# Patient Record
Sex: Female | Born: 1960 | Race: Black or African American | Hispanic: No | Marital: Married | Smoking: Former smoker
Health system: Southern US, Community
[De-identification: ages and names within clinical notes are randomized; demographics above are authoritative.]

## PROBLEM LIST (undated history)

## (undated) DIAGNOSIS — F329 Major depressive disorder, single episode, unspecified: Secondary | ICD-10-CM

## (undated) DIAGNOSIS — F419 Anxiety disorder, unspecified: Secondary | ICD-10-CM

## (undated) DIAGNOSIS — E119 Type 2 diabetes mellitus without complications: Secondary | ICD-10-CM

## (undated) DIAGNOSIS — F1010 Alcohol abuse, uncomplicated: Secondary | ICD-10-CM

## (undated) DIAGNOSIS — G5601 Carpal tunnel syndrome, right upper limb: Secondary | ICD-10-CM

## (undated) DIAGNOSIS — Z87898 Personal history of other specified conditions: Secondary | ICD-10-CM

## (undated) DIAGNOSIS — Z853 Personal history of malignant neoplasm of breast: Secondary | ICD-10-CM

## (undated) DIAGNOSIS — C801 Malignant (primary) neoplasm, unspecified: Secondary | ICD-10-CM

## (undated) DIAGNOSIS — E7800 Pure hypercholesterolemia, unspecified: Secondary | ICD-10-CM

## (undated) DIAGNOSIS — Q112 Microphthalmos: Secondary | ICD-10-CM

## (undated) DIAGNOSIS — G479 Sleep disorder, unspecified: Secondary | ICD-10-CM

## (undated) DIAGNOSIS — Z973 Presence of spectacles and contact lenses: Secondary | ICD-10-CM

## (undated) HISTORY — PX: GASTRIC BYPASS: SHX52

## (undated) HISTORY — PX: ABDOMINAL HYSTERECTOMY: SHX81

## (undated) HISTORY — DX: Anxiety disorder, unspecified: F41.9

## (undated) HISTORY — PX: REDUCTION MAMMAPLASTY: SUR839

## (undated) HISTORY — DX: Personal history of malignant neoplasm of breast: Z85.3

## (undated) HISTORY — DX: Pure hypercholesterolemia, unspecified: E78.00

## (undated) HISTORY — DX: Sleep disorder, unspecified: G47.9

## (undated) HISTORY — DX: Alcohol abuse, uncomplicated: F10.10

## (undated) HISTORY — DX: Microphthalmos: Q11.2

---

## 1997-01-31 DIAGNOSIS — Z853 Personal history of malignant neoplasm of breast: Secondary | ICD-10-CM

## 1997-01-31 HISTORY — PX: MASTECTOMY: SHX3

## 1997-01-31 HISTORY — DX: Personal history of malignant neoplasm of breast: Z85.3

## 1997-11-13 ENCOUNTER — Ambulatory Visit (HOSPITAL_COMMUNITY): Admission: RE | Admit: 1997-11-13 | Discharge: 1997-11-13 | Payer: Self-pay | Admitting: General Surgery

## 1997-12-11 ENCOUNTER — Encounter: Payer: Self-pay | Admitting: General Surgery

## 1997-12-12 ENCOUNTER — Inpatient Hospital Stay (HOSPITAL_COMMUNITY): Admission: RE | Admit: 1997-12-12 | Discharge: 1997-12-14 | Payer: Self-pay | Admitting: General Surgery

## 1998-08-18 ENCOUNTER — Other Ambulatory Visit: Admission: RE | Admit: 1998-08-18 | Discharge: 1998-08-18 | Payer: Self-pay | Admitting: Family Medicine

## 1998-11-09 ENCOUNTER — Other Ambulatory Visit: Admission: RE | Admit: 1998-11-09 | Discharge: 1998-11-09 | Payer: Self-pay | Admitting: Obstetrics and Gynecology

## 1998-11-09 ENCOUNTER — Encounter (INDEPENDENT_AMBULATORY_CARE_PROVIDER_SITE_OTHER): Payer: Self-pay | Admitting: *Deleted

## 1999-01-21 ENCOUNTER — Encounter (INDEPENDENT_AMBULATORY_CARE_PROVIDER_SITE_OTHER): Payer: Self-pay | Admitting: *Deleted

## 1999-01-21 ENCOUNTER — Other Ambulatory Visit: Admission: RE | Admit: 1999-01-21 | Discharge: 1999-01-21 | Payer: Self-pay | Admitting: Plastic Surgery

## 1999-03-15 ENCOUNTER — Other Ambulatory Visit: Admission: RE | Admit: 1999-03-15 | Discharge: 1999-03-15 | Payer: Self-pay | Admitting: Obstetrics and Gynecology

## 1999-05-04 ENCOUNTER — Inpatient Hospital Stay (HOSPITAL_COMMUNITY): Admission: AD | Admit: 1999-05-04 | Discharge: 1999-05-04 | Payer: Self-pay | Admitting: Obstetrics and Gynecology

## 1999-06-16 ENCOUNTER — Encounter: Payer: Self-pay | Admitting: Oncology

## 1999-06-16 ENCOUNTER — Other Ambulatory Visit: Admission: RE | Admit: 1999-06-16 | Discharge: 1999-06-16 | Payer: Self-pay | Admitting: Obstetrics and Gynecology

## 1999-06-16 ENCOUNTER — Ambulatory Visit (HOSPITAL_COMMUNITY): Admission: RE | Admit: 1999-06-16 | Discharge: 1999-06-16 | Payer: Self-pay | Admitting: Oncology

## 1999-07-26 ENCOUNTER — Inpatient Hospital Stay (HOSPITAL_COMMUNITY): Admission: RE | Admit: 1999-07-26 | Discharge: 1999-07-29 | Payer: Self-pay | Admitting: Obstetrics and Gynecology

## 1999-07-26 ENCOUNTER — Encounter (INDEPENDENT_AMBULATORY_CARE_PROVIDER_SITE_OTHER): Payer: Self-pay

## 1999-07-26 HISTORY — PX: TOTAL ABDOMINAL HYSTERECTOMY W/ BILATERAL SALPINGOOPHORECTOMY: SHX83

## 1999-10-25 ENCOUNTER — Ambulatory Visit (HOSPITAL_COMMUNITY): Admission: RE | Admit: 1999-10-25 | Discharge: 1999-10-25 | Payer: Self-pay | Admitting: Oncology

## 1999-11-02 ENCOUNTER — Encounter: Payer: Self-pay | Admitting: Orthopaedic Surgery

## 1999-11-02 ENCOUNTER — Ambulatory Visit (HOSPITAL_COMMUNITY): Admission: RE | Admit: 1999-11-02 | Discharge: 1999-11-02 | Payer: Self-pay | Admitting: Orthopaedic Surgery

## 2000-01-05 ENCOUNTER — Encounter: Payer: Self-pay | Admitting: Oncology

## 2000-01-05 ENCOUNTER — Encounter: Admission: RE | Admit: 2000-01-05 | Discharge: 2000-01-05 | Payer: Self-pay | Admitting: Oncology

## 2000-06-09 ENCOUNTER — Other Ambulatory Visit: Admission: RE | Admit: 2000-06-09 | Discharge: 2000-06-09 | Payer: Self-pay | Admitting: Obstetrics and Gynecology

## 2001-01-30 ENCOUNTER — Encounter: Payer: Self-pay | Admitting: Oncology

## 2001-01-30 ENCOUNTER — Encounter: Admission: RE | Admit: 2001-01-30 | Discharge: 2001-01-30 | Payer: Self-pay | Admitting: Oncology

## 2001-04-11 ENCOUNTER — Emergency Department (HOSPITAL_COMMUNITY): Admission: EM | Admit: 2001-04-11 | Discharge: 2001-04-11 | Payer: Self-pay | Admitting: Emergency Medicine

## 2001-04-11 ENCOUNTER — Encounter: Payer: Self-pay | Admitting: Emergency Medicine

## 2001-04-25 ENCOUNTER — Ambulatory Visit (HOSPITAL_COMMUNITY): Admission: RE | Admit: 2001-04-25 | Discharge: 2001-04-25 | Payer: Self-pay | Admitting: Orthopaedic Surgery

## 2002-09-04 ENCOUNTER — Encounter: Admission: RE | Admit: 2002-09-04 | Discharge: 2002-12-03 | Payer: Self-pay | Admitting: Family Medicine

## 2003-07-08 ENCOUNTER — Encounter: Admission: RE | Admit: 2003-07-08 | Discharge: 2003-07-08 | Payer: Self-pay | Admitting: Oncology

## 2003-07-16 ENCOUNTER — Ambulatory Visit (HOSPITAL_COMMUNITY): Admission: RE | Admit: 2003-07-16 | Discharge: 2003-07-16 | Payer: Self-pay | Admitting: General Surgery

## 2003-07-17 ENCOUNTER — Encounter: Admission: RE | Admit: 2003-07-17 | Discharge: 2003-10-15 | Payer: Self-pay | Admitting: General Surgery

## 2003-07-25 ENCOUNTER — Other Ambulatory Visit: Admission: RE | Admit: 2003-07-25 | Discharge: 2003-07-25 | Payer: Self-pay | Admitting: Family Medicine

## 2003-07-28 ENCOUNTER — Ambulatory Visit (HOSPITAL_BASED_OUTPATIENT_CLINIC_OR_DEPARTMENT_OTHER): Admission: RE | Admit: 2003-07-28 | Discharge: 2003-07-28 | Payer: Self-pay | Admitting: General Surgery

## 2003-07-29 ENCOUNTER — Ambulatory Visit (HOSPITAL_COMMUNITY): Admission: RE | Admit: 2003-07-29 | Discharge: 2003-07-29 | Payer: Self-pay | Admitting: General Surgery

## 2003-10-07 ENCOUNTER — Encounter (INDEPENDENT_AMBULATORY_CARE_PROVIDER_SITE_OTHER): Payer: Self-pay | Admitting: *Deleted

## 2003-10-07 ENCOUNTER — Inpatient Hospital Stay (HOSPITAL_COMMUNITY): Admission: RE | Admit: 2003-10-07 | Discharge: 2003-10-10 | Payer: Self-pay | Admitting: General Surgery

## 2003-10-07 HISTORY — PX: ROUX-EN-Y GASTRIC BYPASS: SHX1104

## 2003-11-19 ENCOUNTER — Encounter: Admission: RE | Admit: 2003-11-19 | Discharge: 2003-11-19 | Payer: Self-pay | Admitting: General Surgery

## 2004-02-18 ENCOUNTER — Encounter: Admission: RE | Admit: 2004-02-18 | Discharge: 2004-05-18 | Payer: Self-pay | Admitting: General Surgery

## 2004-03-04 ENCOUNTER — Emergency Department (HOSPITAL_COMMUNITY): Admission: EM | Admit: 2004-03-04 | Discharge: 2004-03-04 | Payer: Self-pay | Admitting: Emergency Medicine

## 2004-06-04 ENCOUNTER — Encounter: Admission: RE | Admit: 2004-06-04 | Discharge: 2004-06-04 | Payer: Self-pay | Admitting: General Surgery

## 2004-07-02 ENCOUNTER — Ambulatory Visit: Payer: Self-pay | Admitting: Oncology

## 2004-07-16 ENCOUNTER — Ambulatory Visit (HOSPITAL_COMMUNITY): Admission: RE | Admit: 2004-07-16 | Discharge: 2004-07-16 | Payer: Self-pay | Admitting: Surgery

## 2004-08-25 ENCOUNTER — Encounter: Admission: RE | Admit: 2004-08-25 | Discharge: 2004-08-25 | Payer: Self-pay | Admitting: Family Medicine

## 2004-09-06 ENCOUNTER — Encounter: Admission: RE | Admit: 2004-09-06 | Discharge: 2004-09-06 | Payer: Self-pay | Admitting: General Surgery

## 2004-09-10 ENCOUNTER — Ambulatory Visit: Payer: Self-pay | Admitting: Oncology

## 2005-06-01 ENCOUNTER — Encounter: Admission: RE | Admit: 2005-06-01 | Discharge: 2005-06-01 | Payer: Self-pay | Admitting: General Surgery

## 2005-08-21 ENCOUNTER — Emergency Department (HOSPITAL_COMMUNITY): Admission: EM | Admit: 2005-08-21 | Discharge: 2005-08-21 | Payer: Self-pay | Admitting: Emergency Medicine

## 2005-08-21 ENCOUNTER — Inpatient Hospital Stay (HOSPITAL_COMMUNITY): Admission: EM | Admit: 2005-08-21 | Discharge: 2005-08-23 | Payer: Self-pay | Admitting: Emergency Medicine

## 2005-08-22 HISTORY — PX: LAPAROSCOPIC INTERNAL HERNIA REPAIR: SHX6502

## 2005-08-30 ENCOUNTER — Encounter: Admission: RE | Admit: 2005-08-30 | Discharge: 2005-08-30 | Payer: Self-pay | Admitting: General Surgery

## 2006-02-18 ENCOUNTER — Emergency Department (HOSPITAL_COMMUNITY): Admission: EM | Admit: 2006-02-18 | Discharge: 2006-02-18 | Payer: Self-pay | Admitting: Emergency Medicine

## 2006-08-24 ENCOUNTER — Encounter: Admission: RE | Admit: 2006-08-24 | Discharge: 2006-08-24 | Payer: Self-pay | Admitting: General Surgery

## 2006-10-24 ENCOUNTER — Emergency Department (HOSPITAL_COMMUNITY): Admission: EM | Admit: 2006-10-24 | Discharge: 2006-10-25 | Payer: Self-pay | Admitting: Emergency Medicine

## 2006-11-29 ENCOUNTER — Ambulatory Visit: Payer: Self-pay | Admitting: Oncology

## 2006-11-29 LAB — COMPREHENSIVE METABOLIC PANEL
ALT: 131 U/L — ABNORMAL HIGH (ref 0–35)
AST: 103 U/L — ABNORMAL HIGH (ref 0–37)
Albumin: 4.3 g/dL (ref 3.5–5.2)
Alkaline Phosphatase: 118 U/L — ABNORMAL HIGH (ref 39–117)
BUN: 9 mg/dL (ref 6–23)
CO2: 24 mEq/L (ref 19–32)
Calcium: 8.9 mg/dL (ref 8.4–10.5)
Chloride: 106 mEq/L (ref 96–112)
Creatinine, Ser: 0.69 mg/dL (ref 0.40–1.20)
Glucose, Bld: 102 mg/dL — ABNORMAL HIGH (ref 70–99)
Potassium: 3.6 mEq/L (ref 3.5–5.3)
Sodium: 139 mEq/L (ref 135–145)
Total Bilirubin: 0.3 mg/dL (ref 0.3–1.2)
Total Protein: 7.3 g/dL (ref 6.0–8.3)

## 2006-11-29 LAB — CBC WITH DIFFERENTIAL/PLATELET
BASO%: 0.3 % (ref 0.0–2.0)
Basophils Absolute: 0 10*3/uL (ref 0.0–0.1)
EOS%: 2 % (ref 0.0–7.0)
Eosinophils Absolute: 0.1 10*3/uL (ref 0.0–0.5)
HCT: 36.7 % (ref 34.8–46.6)
HGB: 12.1 g/dL (ref 11.6–15.9)
LYMPH%: 34.7 % (ref 14.0–48.0)
MCH: 29.1 pg (ref 26.0–34.0)
MCHC: 33 g/dL (ref 32.0–36.0)
MCV: 88.3 fL (ref 81.0–101.0)
MONO#: 0.4 10*3/uL (ref 0.1–0.9)
MONO%: 7.7 % (ref 0.0–13.0)
NEUT#: 2.6 10*3/uL (ref 1.5–6.5)
NEUT%: 55.3 % (ref 39.6–76.8)
Platelets: 319 10*3/uL (ref 145–400)
RBC: 4.16 10*6/uL (ref 3.70–5.32)
RDW: 13.5 % (ref 11.3–14.5)
WBC: 4.7 10*3/uL (ref 3.9–10.0)
lymph#: 1.6 10*3/uL (ref 0.9–3.3)

## 2006-11-29 LAB — CANCER ANTIGEN 27.29: CA 27.29: 21 U/mL (ref 0–39)

## 2006-11-30 ENCOUNTER — Ambulatory Visit (HOSPITAL_COMMUNITY): Admission: RE | Admit: 2006-11-30 | Discharge: 2006-11-30 | Payer: Self-pay | Admitting: Obstetrics and Gynecology

## 2007-01-03 LAB — HEPATIC FUNCTION PANEL
ALT: 62 U/L — ABNORMAL HIGH (ref 0–35)
AST: 47 U/L — ABNORMAL HIGH (ref 0–37)
Albumin: 4.3 g/dL (ref 3.5–5.2)
Alkaline Phosphatase: 131 U/L — ABNORMAL HIGH (ref 39–117)
Bilirubin, Direct: 0.1 mg/dL (ref 0.0–0.3)
Indirect Bilirubin: 0.2 mg/dL (ref 0.0–0.9)
Total Bilirubin: 0.3 mg/dL (ref 0.3–1.2)
Total Protein: 7.5 g/dL (ref 6.0–8.3)

## 2007-01-13 ENCOUNTER — Ambulatory Visit (HOSPITAL_COMMUNITY): Admission: RE | Admit: 2007-01-13 | Discharge: 2007-01-13 | Payer: Self-pay | Admitting: Oncology

## 2007-01-16 ENCOUNTER — Ambulatory Visit: Payer: Self-pay | Admitting: Oncology

## 2007-01-27 ENCOUNTER — Emergency Department (HOSPITAL_COMMUNITY): Admission: EM | Admit: 2007-01-27 | Discharge: 2007-01-27 | Payer: Self-pay | Admitting: Emergency Medicine

## 2007-03-18 ENCOUNTER — Emergency Department (HOSPITAL_COMMUNITY): Admission: EM | Admit: 2007-03-18 | Discharge: 2007-03-18 | Payer: Self-pay | Admitting: Emergency Medicine

## 2007-10-11 ENCOUNTER — Encounter: Admission: RE | Admit: 2007-10-11 | Discharge: 2007-10-11 | Payer: Self-pay | Admitting: General Surgery

## 2007-10-17 ENCOUNTER — Encounter: Admission: RE | Admit: 2007-10-17 | Discharge: 2007-10-17 | Payer: Self-pay | Admitting: General Surgery

## 2007-11-16 ENCOUNTER — Ambulatory Visit: Payer: Self-pay | Admitting: Oncology

## 2007-11-21 LAB — CBC WITH DIFFERENTIAL/PLATELET
BASO%: 0.9 % (ref 0.0–2.0)
Basophils Absolute: 0.1 10*3/uL (ref 0.0–0.1)
EOS%: 2.5 % (ref 0.0–7.0)
Eosinophils Absolute: 0.2 10*3/uL (ref 0.0–0.5)
HCT: 37.1 % (ref 34.8–46.6)
HGB: 12.2 g/dL (ref 11.6–15.9)
LYMPH%: 35.8 % (ref 14.0–48.0)
MCH: 27.9 pg (ref 26.0–34.0)
MCHC: 32.9 g/dL (ref 32.0–36.0)
MCV: 84.7 fL (ref 81.0–101.0)
MONO#: 0.6 10*3/uL (ref 0.1–0.9)
MONO%: 8.5 % (ref 0.0–13.0)
NEUT#: 3.4 10*3/uL (ref 1.5–6.5)
NEUT%: 52.3 % (ref 39.6–76.8)
Platelets: 340 10*3/uL (ref 145–400)
RBC: 4.39 10*6/uL (ref 3.70–5.32)
RDW: 13.9 % (ref 11.3–14.5)
WBC: 6.5 10*3/uL (ref 3.9–10.0)
lymph#: 2.3 10*3/uL (ref 0.9–3.3)

## 2007-11-21 LAB — COMPREHENSIVE METABOLIC PANEL
ALT: 46 U/L — ABNORMAL HIGH (ref 0–35)
AST: 31 U/L (ref 0–37)
Albumin: 4.6 g/dL (ref 3.5–5.2)
Alkaline Phosphatase: 108 U/L (ref 39–117)
BUN: 13 mg/dL (ref 6–23)
CO2: 26 mEq/L (ref 19–32)
Calcium: 9.3 mg/dL (ref 8.4–10.5)
Chloride: 103 mEq/L (ref 96–112)
Creatinine, Ser: 0.77 mg/dL (ref 0.40–1.20)
Glucose, Bld: 68 mg/dL — ABNORMAL LOW (ref 70–99)
Potassium: 4.1 mEq/L (ref 3.5–5.3)
Sodium: 141 mEq/L (ref 135–145)
Total Bilirubin: 0.3 mg/dL (ref 0.3–1.2)
Total Protein: 7.8 g/dL (ref 6.0–8.3)

## 2007-11-21 LAB — CANCER ANTIGEN 27.29: CA 27.29: 28 U/mL (ref 0–39)

## 2008-01-05 ENCOUNTER — Encounter: Admission: RE | Admit: 2008-01-05 | Discharge: 2008-01-05 | Payer: Self-pay | Admitting: Sports Medicine

## 2008-02-06 ENCOUNTER — Ambulatory Visit (HOSPITAL_COMMUNITY): Admission: RE | Admit: 2008-02-06 | Discharge: 2008-02-06 | Payer: Self-pay | Admitting: Sports Medicine

## 2008-09-10 IMAGING — CT CT ABDOMEN W/ CM
2 of 5 series · 17 of 46 positions shown, 19 images · IV contrast (omnipaque)
Comparison: 08/24/2006

ABDOMEN CT WITH CONTRAST

CLINICAL DATA: Abdominal pain and nausea.
TECHNIQUE: Multidetector CT imaging of the abdomen and pelvis was performed
following the standard protocol during bolus administration of intravenous
contrast.

Contrast:  125 cc Omnipaque 300

[Series 2: abd_pel 5.0 b40f st · axial · 0.68mm/px · z∈[-404,-24]mm · 14 of 86 slices shown, 16 images]
[im 5/86  soft-tissue]
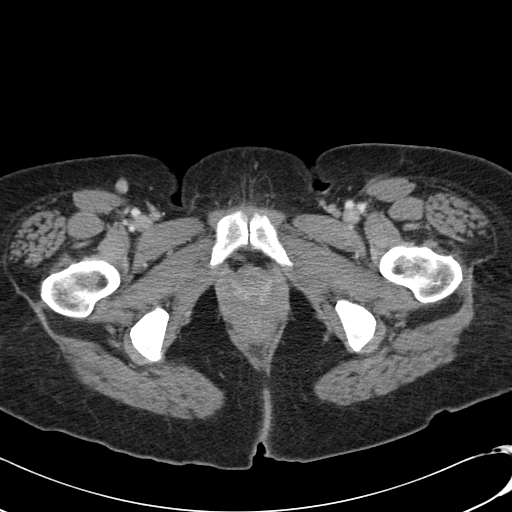
[im 5/86  bone]
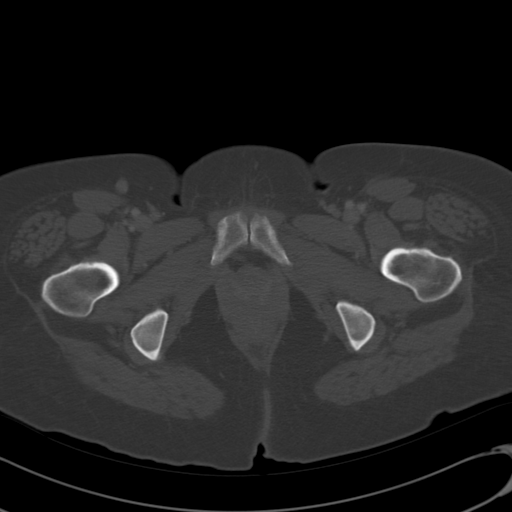
[im 9/86  soft-tissue]
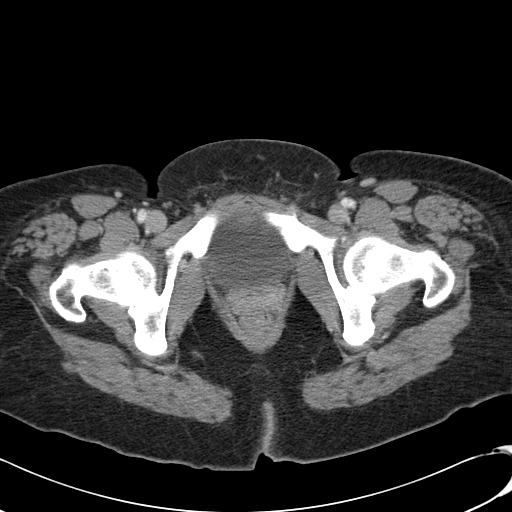
[im 18/86  soft-tissue]
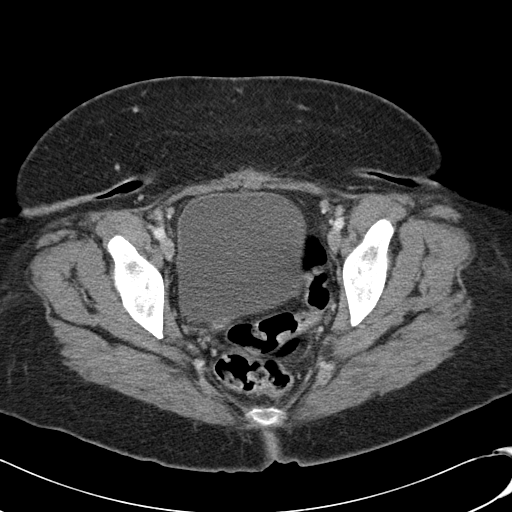
[im 23/86  soft-tissue]
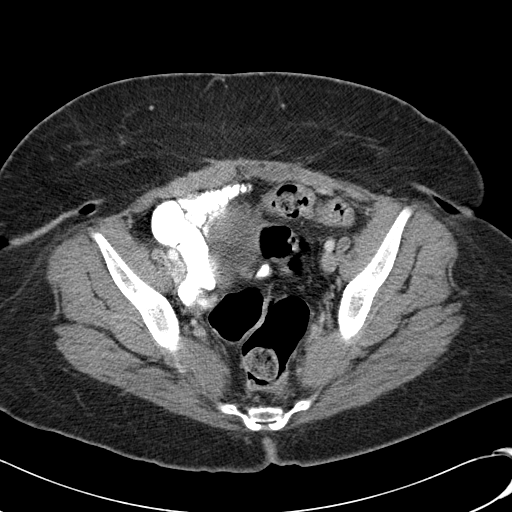
[im 27/86  soft-tissue]
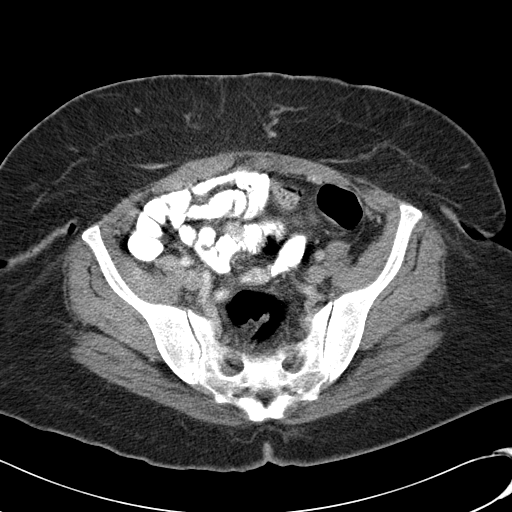
[im 36/86  soft-tissue]
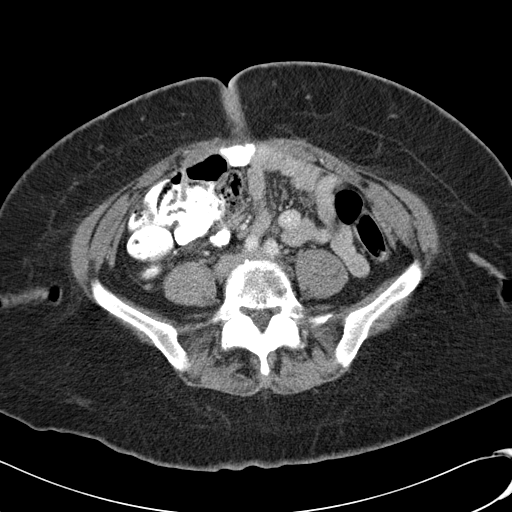
[im 41/86  soft-tissue]
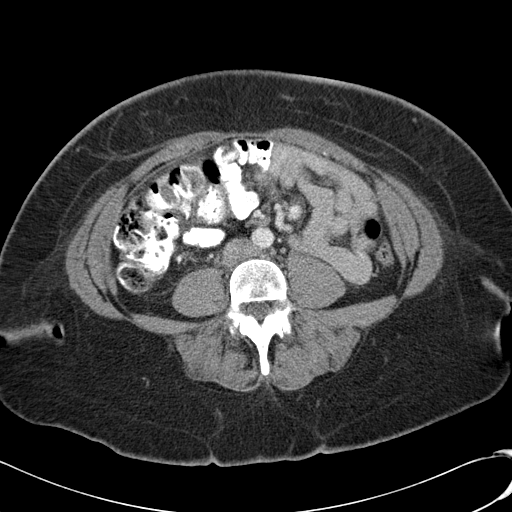
[im 45/86  soft-tissue]
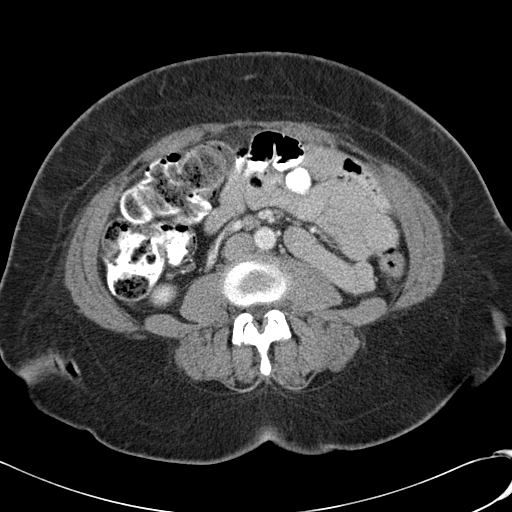
[im 50/86  soft-tissue]
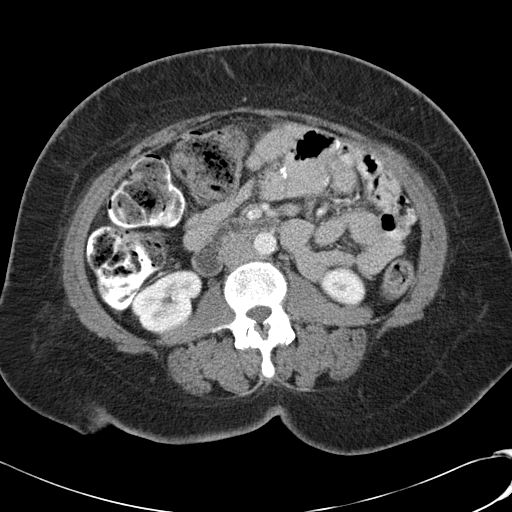
[im 50/86  bone]
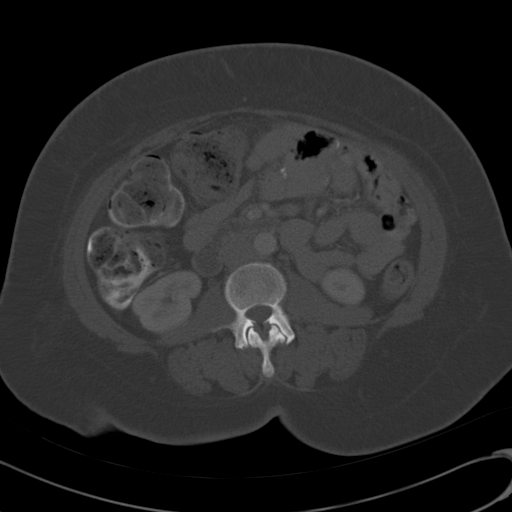
[im 59/86  soft-tissue]
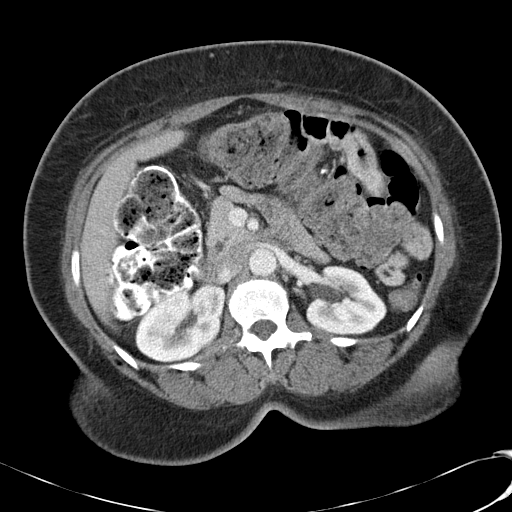
[im 63/86  soft-tissue]
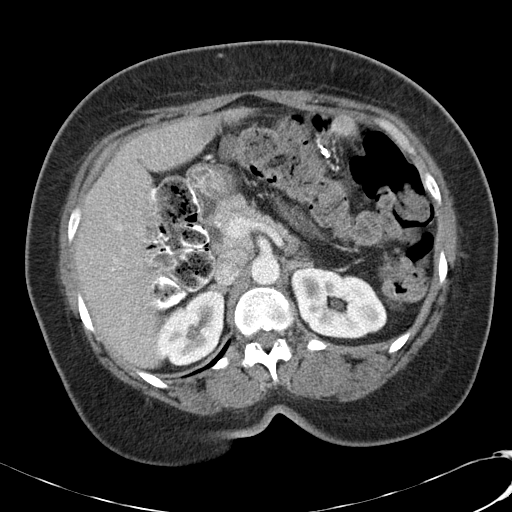
[im 68/86  soft-tissue]
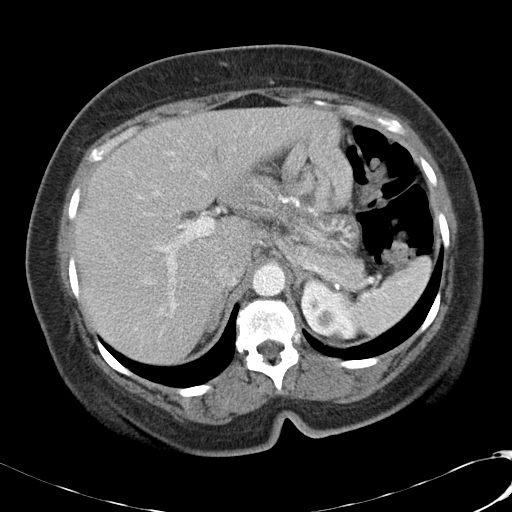
[im 77/86  soft-tissue]
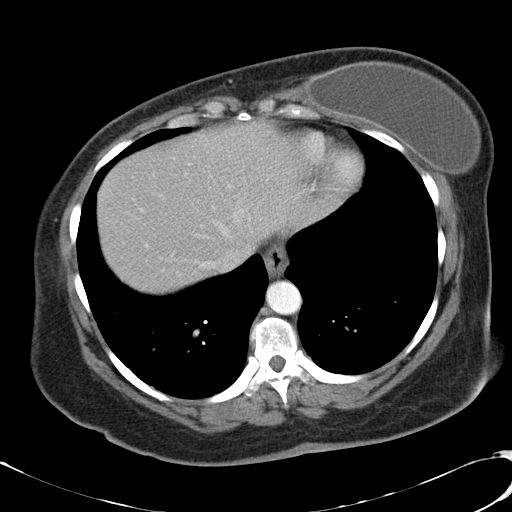
[im 81/86  soft-tissue]
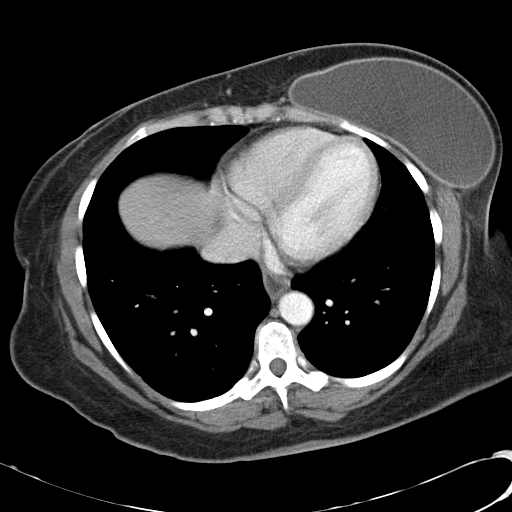

[Series 602: coronal images · coronal · 0.87mm/px · 3 of 76 slices shown]
[im 26/76  soft-tissue]
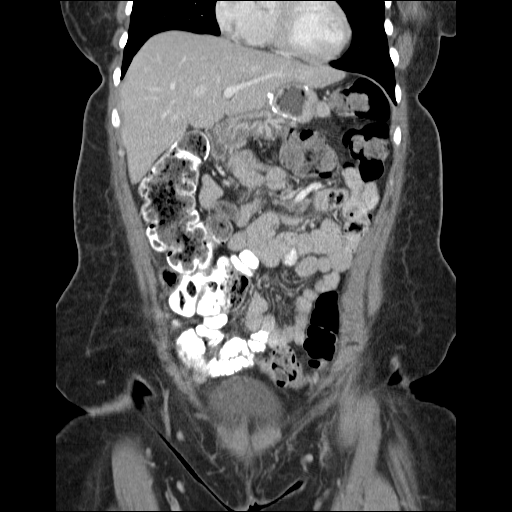
[im 34/76  soft-tissue]
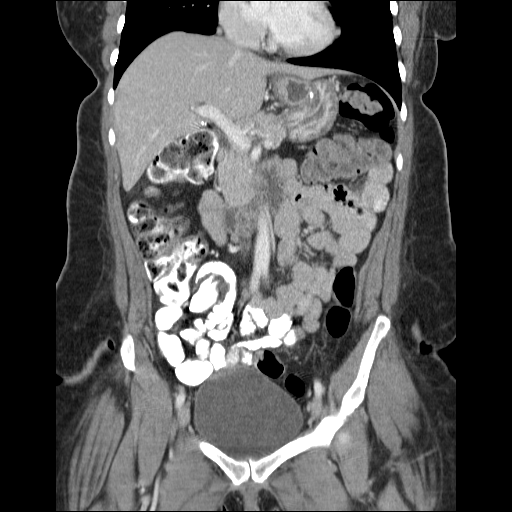
[im 42/76  soft-tissue]
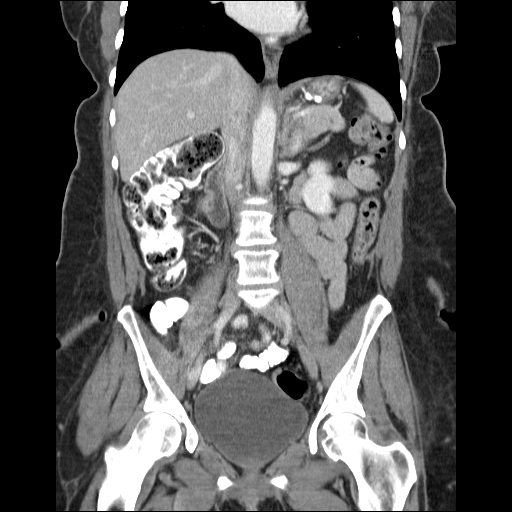

[17 of 46 positions shown; findings below may reference images not displayed]

FINDINGS: Prior cholecystectomy noted. Liver appears unremarkable. Prior
gastrojejunostomy noted. The spleen, pancreas, and adrenal glands appear normal.
Exophytic lesion from the left kidney likely represents cysts. Right-sided
extrarenal pelvis noted no pathologic retroperitoneal adenopathy noted. No
dilated upper abdominal bowel. Contrast medium makes its way through to the
colon, where there is mild prominence of proximal stool.

IMPRESSION

1. No acute upper abdominal findings.
2. Mild prominence of stool in the colon, particularly proximally.

PELVIS CT WITH CONTRAST
FINDINGS: Urinary bladder appears unremarkable. No free pelvic fluid. No
dilated pelvic bowel noted. The distal ileum appears unremarkable. The uterus
and ovaries are absent.

IMPRESSION

1. No acute pelvic findings.

## 2009-01-19 ENCOUNTER — Encounter: Admission: RE | Admit: 2009-01-19 | Discharge: 2009-01-19 | Payer: Self-pay | Admitting: Family Medicine

## 2009-06-21 ENCOUNTER — Emergency Department (HOSPITAL_BASED_OUTPATIENT_CLINIC_OR_DEPARTMENT_OTHER): Admission: EM | Admit: 2009-06-21 | Discharge: 2009-06-22 | Payer: Self-pay | Admitting: Emergency Medicine

## 2010-01-30 ENCOUNTER — Emergency Department (HOSPITAL_COMMUNITY)
Admission: EM | Admit: 2010-01-30 | Discharge: 2010-01-31 | Payer: Self-pay | Source: Home / Self Care | Admitting: Emergency Medicine

## 2010-02-21 ENCOUNTER — Encounter: Payer: Self-pay | Admitting: General Surgery

## 2010-02-21 ENCOUNTER — Encounter: Payer: Self-pay | Admitting: Sports Medicine

## 2010-02-21 ENCOUNTER — Encounter: Payer: Self-pay | Admitting: Oncology

## 2010-04-12 LAB — CBC
HCT: 36.4 % (ref 36.0–46.0)
Hemoglobin: 11.6 g/dL — ABNORMAL LOW (ref 12.0–15.0)
MCH: 26.7 pg (ref 26.0–34.0)
MCHC: 31.9 g/dL (ref 30.0–36.0)
MCV: 83.7 fL (ref 78.0–100.0)
Platelets: 314 10*3/uL (ref 150–400)
RBC: 4.35 MIL/uL (ref 3.87–5.11)
RDW: 15.3 % (ref 11.5–15.5)
WBC: 10 10*3/uL (ref 4.0–10.5)

## 2010-04-12 LAB — DIFFERENTIAL
Basophils Absolute: 0 10*3/uL (ref 0.0–0.1)
Basophils Relative: 0 % (ref 0–1)
Eosinophils Absolute: 0.2 10*3/uL (ref 0.0–0.7)
Eosinophils Relative: 2 % (ref 0–5)
Lymphocytes Relative: 38 % (ref 12–46)
Lymphs Abs: 3.8 10*3/uL (ref 0.7–4.0)
Monocytes Absolute: 0.7 10*3/uL (ref 0.1–1.0)
Monocytes Relative: 7 % (ref 3–12)
Neutro Abs: 5.4 10*3/uL (ref 1.7–7.7)
Neutrophils Relative %: 54 % (ref 43–77)

## 2010-04-12 LAB — COMPREHENSIVE METABOLIC PANEL
ALT: 18 U/L (ref 0–35)
AST: 28 U/L (ref 0–37)
Albumin: 3.4 g/dL — ABNORMAL LOW (ref 3.5–5.2)
Alkaline Phosphatase: 96 U/L (ref 39–117)
BUN: 11 mg/dL (ref 6–23)
CO2: 22 mEq/L (ref 19–32)
Calcium: 8.2 mg/dL — ABNORMAL LOW (ref 8.4–10.5)
Chloride: 105 mEq/L (ref 96–112)
Creatinine, Ser: 0.77 mg/dL (ref 0.4–1.2)
GFR calc Af Amer: >60 mL/min (ref >60)
GFR calc non Af Amer: >60 mL/min (ref >60)
Glucose, Bld: 109 mg/dL — ABNORMAL HIGH (ref 70–99)
Potassium: 3.4 mEq/L — ABNORMAL LOW (ref 3.5–5.1)
Sodium: 138 mEq/L (ref 135–145)
Total Bilirubin: 0.2 mg/dL — ABNORMAL LOW (ref 0.3–1.2)
Total Protein: 7.3 g/dL (ref 6.0–8.3)

## 2010-04-12 LAB — URINALYSIS, ROUTINE W REFLEX MICROSCOPIC
Bilirubin Urine: NEGATIVE
Glucose, UA: NEGATIVE mg/dL
Hgb urine dipstick: NEGATIVE
Nitrite: NEGATIVE
Protein, ur: NEGATIVE mg/dL
Specific Gravity, Urine: 1.03 (ref 1.005–1.030)
Urobilinogen, UA: 0.2 mg/dL (ref 0.0–1.0)
pH: 5.5 (ref 5.0–8.0)

## 2010-04-12 LAB — URINE MICROSCOPIC-ADD ON

## 2010-04-12 LAB — LIPASE, BLOOD: Lipase: 24 U/L (ref 11–59)

## 2010-06-15 NOTE — Op Note (Signed)
NAMEBURNICE, VASSEL                ACCOUNT NO.:  [id number]   MEDICAL RECORD NO.:  [id number]          PATIENT TYPE:  AMB   LOCATION:  ENDO                         FACILITY:  [facility]   PHYSICIAN:  Sandria Bales. Ezzard Standing, M.D.  DATE OF BIRTH:  11/30/1960   DATE OF PROCEDURE:  11/30/2006  DATE OF DISCHARGE:                               OPERATIVE REPORT   PREOPERATIVE DIAGNOSIS:  Epigastric abdominal pain.   POSTOPERATIVE DIAGNOSIS:  Epigastric abdominal pain with normal  esophagus, gastric pouch and gastrojejunal anastomosis.   PROCEDURE:  Esophagogastrojejunoscopy.   SURGEON:  Sandria Bales. Ezzard Standing, M.D.  No first assistant.   ANESTHESIA  50 mcg of fentanyl, 4 mg of Versed.   COMPLICATIONS:  None.   INDICATIONS FOR PROCEDURE:  Miss Gurevich is a 50 year old black female  who is a patient of Dr. Freda Jackson, who had a laparoscopic Roux-en-Y  gastric bypass in September 2005 by Dr. Jaclynn Guarneri.   She has lost approximately 120 pounds of since her surgery and has  maintained a weight of about 175 pounds.  She has had some episodes of  epigastric abdominal pain.  She has had a prior repair of an internal  hernia defect repair by Dr. Johna Sheriff in July 2007 and she has had a  prior upper endoscopy by Dr. Ovidio Kin on July 16, 2004.   Because of her recurrent abdominal pain, I have been asked to repeat her  endoscope and make sure there is nothing wrong with her pouch and then  Dr. Johna Sheriff will have to decide if she needs any further surgical  intervention.   Indications and potential complications explained to the patient.   OPERATIVE NOTE:  The patient in the left lateral decubitus position.  I  used Cetacaine to anesthetize the back of her throat.  She had an IV in  place.  She was monitered with an EKG, a pulse oximeter and blood  pressure cuff and was getting 2 L nasal O2 during the procedure.   She was given 50 mcg of fentanyl, 4 mg of Versed.  I passed the Pentax  endoscope down the  back her throat without difficulty.   I was able to get the scope into her pouch without any trouble.  I  advanced it down the efferent jejunal limb by 20-30 cm.  This was  totally normal.  Her gastrojejunal anastomosis was approximately at 38-  39 cm.  It was widely patent and normal without ulcer or other problem.  Her gastric pouch was approximately 4 cm in size with the  esophagogastric junction at about 34 cm.  There was no ulcer, no mass no  other suggestion of abnormality.  Her distal esophagus was unremarkable.   This is felt to be a normal endoscopy with no evidence of gastritis or  ulceration.  I did not do any biopsies.  She tolerated the procedure  well.  Photos were taken and placed in the chart.  I will be in touch  with Dr. Johna Sheriff regarding further diagnostic tests.      Sandria Bales. Ezzard Standing, M.D.  Electronically Signed  DHN/MEDQ  D:  11/30/2006  T:  12/01/2006  Job:  [id number]   cc:   Lorne Skeens. Hoxworth, M.D.  [address], [address]  [place]  [place] [address]

## 2010-06-18 NOTE — Consult Note (Signed)
Sheryl Landry, Sheryl Landry                ACCOUNT NO.:  [id number]   MEDICAL RECORD NO.:  [id number]          PATIENT TYPE:  EMS   LOCATION:  MAJO                         FACILITY:  [facility]   PHYSICIAN:  Sandria Bales. Ezzard Standing, M.D.  DATE OF BIRTH:  1960-04-24   DATE OF CONSULTATION:  DATE OF DISCHARGE:                                   CONSULTATION   PRESENTING SYMPTOMS:  Right-sided abdominal pain.   HISTORY OF PRESENT ILLNESS:  Sheryl Landry is a 50 year old black female who is  a patient of Miguel Aschoff, M.D., who saw Dr. Theresia Lo about two days ago for a  cold and was given some Zyrtec for this cold.  The symptoms of that are  better but she started this morning with a right-sided abdominal pain which  has gotten steadily worse.  She has had no nausea or vomiting, no fever, no  change in bowel habits.  She had a laparoscopic Roux-en-Y and a gastric  bypass on October 07, 2003.  At the same time she had a cholecystectomy.  These was done by Dr. Jaclynn Guarneri.  Her weight I think at the time of  surgery was about 278.  She says she is down to about 224 pounds now.   The last time she saw Dr. Johna Sheriff was approximately six to eight weeks ago.   Her significant GI history is that she had again a cholecystectomy done at  the same time as her bypass.  She had a hysterectomy in 2000 by Dr. Purvis Sheffield.  She had a C. section about 1993.  As far as she knows, she still  has her appendix.  She has bowel movements every day or every other day, has  not noticed any change in her bowel habits or any chronic abdominal pain.   She denies any known history of liver disease, pancreatic disease or colon  disease.   PAST MEDICAL HISTORY:  She has no known allergies.   CURRENT MEDICATIONS:  1.  Ambien.  2.  Seroquel twice a day, unknown dosage.  3.  Prozac.   REVIEW OF SYMPTOMS:  NEUROLOGIC:  No history of seizure or loss of  consciousness.  PULMONARY:  Did not smoke cigarettes.  No history of pneumonia  or  tuberculosis.  CARDIAC:  No history of heart disease, chest pain or hypertension.  GASTROINTESTINAL:  She does have a history of gastroesophageal reflux before  her surgery which has improved.  UROLOGIC:  No history of kidney stones or kidney infection.  ENDOCRINE:  She had adult onset diabetes mellitus but is off her medicines  now.  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   She also, from a breast standpoint, had a left breast cancer treated with  mastectomy.  She got postoperative chemotherapy and was then placed on  Fareston for five years as an aromatase inhibitor.  Has done well from her  breast cancer with no evidence of recurrent disease.  She is followed by Dr.  Ruthann Cancer for this.   PHYSICAL  EXAMINATION:  GENERAL APPEARANCE:  She is a well-nourished,  pleasant black female, alert and cooperative with physical examination.  VITAL SIGNS:  Temperature 99, blood pressure 124/89, pulse 77, respirations  20.  HEENT:  Unremarkable.  NECK:  Supple.  I feel no masses or thyromegaly.  LUNGS:  Clear to auscultation.  CARDIOVASCULAR:  Her heart has a regular rate and rhythm.  I hear no murmur  or rub.  BREASTS:  She is absent of her left breast.  I feel no masses on the left  breast side, her right breast has no mass or lesion.  ABDOMEN:  She has present bowel sounds but she is tender and sore on the  right at abdomen, almost mid abdomen, maybe with a little bit of guarding  but she has present bowel sounds.  I feel no organomegaly or mass.  RECTAL:  I did not do a rectal exam on her.  EXTREMITIES:  She has good strength in all four extremities.  NEUROLOGIC:  Grossly intact.   IMPRESSION:  1.  Right-sided abdominal pain. This may be some type of vital      gastroenterology but also worry about some complication of her bariatric      surgery versus early appendicitis.  I have ordered labs including a CBC      and  electrolytes and I am getting a CT of her abdomen tonight.  These      results are pending at the time of dictation.  [Final CT scan - negative]   1.  Depression on Seroquel and Prozac.  2.  Mild constipation.  3.  Status post gastroesophageal reflux disease.  4.  History of arthritis.  5.  History of diabetes mellitus but off medicine now.  6.  History of breast cancer with no evidence of recurrence.      DHN/MEDQ  D:  03/04/2004  T:  03/04/2004  Job:  [id number]   cc:   Miguel Aschoff, M.D.  [address]  [place]  [place] [address]  Fax: [phone]   Edwena Felty. Romine, M.D.  [address]  [place]  [place] [address]  Fax: [phone]   Valentino Hue. Magrinat, M.D.  [address] [facility]  [place]  [place] [address]  Fax: [phone]   Lorne Skeens. Hoxworth, M.D.  [address], [address]  [place]  [place] [address]

## 2010-06-18 NOTE — Op Note (Signed)
[name], [name]                ACCOUNT NO.:  [id number]   MEDICAL RECORD NO.:  [id number]          PATIENT TYPE:  INP   LOCATION:  1513                         FACILITY:  [facility]   PHYSICIAN:  Sharlet Salina T. Hoxworth, M.D.DATE OF BIRTH:  [birth year]   DATE OF PROCEDURE:  08/22/2005  DATE OF DISCHARGE:                                 OPERATIVE REPORT   PREOPERATIVE DIAGNOSIS:  Abdominal pain status post laparoscopic Roux-Y  gastric bypass, rule out internal hernia.   POSTOPERATIVE DIAGNOSIS:  Abdominal pain status post laparoscopic Roux-Y  gastric bypass, rule out internal hernia, with Peterson's defect hernia.   SURGICAL PROCEDURES:  Laparoscopy and repair of Peterson's defect hernia.   SURGEON:  Dr. Johna Sheriff.   ANESTHESIA:  General.   BRIEF HISTORY:  Sheryl Landry is a 50 year old female, approach 2 years  following laparoscopic gastric bypass with over 100 pounds weight loss.  She  has had an episode of upper abdominal pain that resolved about 9 months ago,  but now presents with recurrent and now persistent epigastric and left upper  quadrant abdominal pain.  CT scan has shown some edema of the mesentery of  bowel loops in the left upper quadrant.  With the concern over of the  possibility of internal hernia, I have recommended proceeding with  laparoscopy.  The nature of procedure, indications, risks of bleeding,  infection, open procedure, and bowel injury were discussed and understood.  She is now brought to the operating room for this procedure.   DESCRIPTION OF OPERATION:  The patient was brought to the operating room and  placed in supine position on the operating table and general endotracheal  anesthesia was induced.  The abdomen was widely sterilely prepped and  draped.  She received preoperative Lovenox 40 mg subcutaneously and  cefoxitin IV.  Correct patient and procedure were verified.  Access was  obtained with a 1 cm incision at the umbilicus.  Dissection carried  down the  midline fascia, sharply incised for 1 cm.  The peritoneum entered under  direct vision.  Through a mattress suture of 0 Vicryl, the Hasson trocar was  placed and pneumoperitoneum established.  Under direct vision 11-mm trocars  were placed in the right subcostal space and right midabdomen.  Bowel loops  appeared grossly normal, possibly mildly dilated.  There was no fluid in the  abdominal cavity.  The cecum and ileocecal valve were identified, and the  bowel was run proximally up to the jejunojejunostomy.  Following this, the  biliopancreatic limb was run proximally up to the ligament of Treitz, and  all this bowel lay in its normal position.  The biliopancreatic limb was  traced back to the jejunojejunostomy, and then the Roux limb was traced back  over the colon to the gastric pouch.  All this appeared normal and in normal  anatomic position.  However, examining behind the mesentery of the Roux  limb, there was a clear fairly discrete Peterson's defect, measuring about 3-  4 cm in diameter, bordered by the mesentery of the Roux limb, the transverse  colon and transverse mesocolon.  This  certainly appeared to be a potential  for intermittent herniation, although there was no bowel in this at the time  of our laparoscopy.  This defect was then repaired with running 2-0 silk,  securely closing the entire defect.  I carefully examined behind the  jejunojejunostomy and there was actually a tiny, probably less than 1 cm,  defect in the old mesenteric repair.  Due to concern over this eventually  enlarging and becoming significant, I did close this with a figure-of-eight  suture of five 2-0 silk.  The bowel was again run in all directions and  appeared entirely normal, and again, lying in normal position.  There was  inspected for hemostasis which appeared complete.  Trocars were removed and  all CO2 evacuated.  The mattress suture was  secured at the umbilicus.  Skin incisions were  closed with interrupted  subcuticular 4-0 Monocryl and Steri-Strips.  Sponge and instrument counts  were correct.  Dry sterile dressings were applied, and the patient taken to  recovery in good condition.      Lorne Skeens. Hoxworth, M.D.  Electronically Signed     BTH/MEDQ  D:  08/22/2005  T:  08/22/2005  Job:  [id number]

## 2010-06-18 NOTE — H&P (Signed)
[name], [name]                ACCOUNT NO.:  [id number]   MEDICAL RECORD NO.:  [id number]          PATIENT TYPE:  EMS   LOCATION:  ED                           FACILITY:  [facility]   PHYSICIAN:  Lorne Skeens. Hoxworth, M.D.DATE OF BIRTH:  Dec 17, 1960   DATE OF ADMISSION:  08/21/2005  DATE OF DISCHARGE:                                HISTORY & PHYSICAL   CHIEF COMPLAINTS:  Abdominal pain.   HISTORY OF PRESENT ILLNESS:  Ms. Sheryl Landry is a 50 year old black female  with a history of laparoscopic Roux-en-Y gastric bypass and cholecystectomy  nearly two years ago.  She now presents with a two-day history of persistent  epigastric pain.  She describes sharp constant epigastric pain that has been  fairly severe and has not allowed her to sleep.  She actually had a similar  episode of pain about 10 months ago with a negative workup including CT scan  and at that time the pain quickly resolved and no further intervention was  done.  She states that she has had some intermittent mild similar pain since  that time and then had a slightly more severe episode two weeks ago that  resolved.  She has not had any nausea or vomiting.  She has been eating  without difficulty.  She has been having normal bowel movements.  No fever  or chills.  She has had some increased frequency of urination but no burning  over the last few days.  No abdominal distension noted.   PAST MEDICAL HISTORY:  Surgery:  1.  Laparoscopic Roux-en-Y gastric bypass, plus cholecystectomy in September      2005.  2.  History of left mastectomy with reconstruction for cancer of left breast      over five years ago.  3.  Status post hysterectomy   Medical:  1.  ADOM, oral agent controlled, improved post bypass.  2.  Arthritis.  3.  Depression.   MEDICATIONS:  Prozac 40 mg daily, Avandia 4 mg a day.   ALLERGIES:  CODEINE. Marland Kitchen   SOCIAL HISTORY:  Married.  No cigarette or alcohol use.  She is employed.   FAMILY HISTORY:   Noncontributory.   REVIEW OF SYSTEMS:  GENERAL:  Weight stable.  No fever, chills.  RESPIRATORY:  No shortness breath, cough, or wheezing.  CARDIAC:  No chest  pain, palpitations.  BREASTS:  No lumps.  Mammograms up to date.  ABDOMEN:  GI as above.  GU:  As above.  MUSCULOSKELETAL:  She has some chronic pain in  her knees.   PHYSICAL EXAMINATION:  VITAL SIGNS:  Temperature is 99, heart rate 75,  respirations 20, blood pressure 140/93.  GENERAL:  She is a mildly overweight black female, who does not appear in  acute distress.  SKIN:  Warm, dry.  No rash or infection.  HEENT:  No palpable mass, thyromegaly.  Sclerae nonicteric.  Nares and  oropharynx clear.  LYMPH NODES:  No cervical, subclavicular, axillary, or  inguinal nodes palpable.  LUNGS:  Clear without wheezing or increased work of breathing.  CARDIAC:  Regular rate and  rhythm.  No murmurs, no edema.  BREASTS:  Right breast status post reduction, no masses. The left chest wall  is reconstructed, no palpable masses or axillary masses.  ABDOMEN:  Well-healed trocar sites.  Fairly large panniculus.  There is  moderate epigastric tenderness and bilateral upper quadrant tenderness with  some guarding.  No distension.  No palpable masses or organomegaly.  No  trocar site hernia is palpable.  EXTREMITIES:  No joint swelling or deformity.  NEUROLOGIC:  Alert and oriented. Motor and sensory exams grossly normal.   LABORATORY:  CBC shows white count 5.5, hematocrit 37.3.  Electrolytes and  LFTs are normal.  Urinalysis unremarkable.  Flat and upright abdominal and  chest x-ray shows some increase stool in the colon, no evidence of  obstruction.   ASSESSMENT/PLAN:  Persistent epigastric pain in this patient almost two  years status post laparoscopic Roux-en-Y gastric bypass.  Concern is for  possible internal hernia.  The patient will be admitted.  I am going to  obtain an immediate CT scan of the abdomen with contrast to look for this   diagnosis and to rule out any other cause for pain.  Even if the CT is  negative and if her pain persists she likely will require diagnostic  laparoscopy.      Lorne Skeens. Hoxworth, M.D.  Electronically Signed     BTH/MEDQ  D:  08/21/2005  T:  08/21/2005  Job:  [id number]

## 2010-06-18 NOTE — Discharge Summary (Signed)
[facility]  Patient:    Sheryl Landry, Sheryl Landry                       MRN: [id number] Adm. Date:  [id number] Disc. Date: [id number] Attending:  Brynda Peon CC:         Edwena Felty. Ashley Royalty, M.D.                           Discharge Summary  DISCHARGE DIAGNOSES: 1. Menorrhagia with anemia. 2. Dysmenorrhea. 3. Personal history of breast cancer.  PROCEDURES:  Total abdominal hysterectomy, bilateral salpingo-oophorectomy on July 26, 1999.  HISTORY OF PRESENT ILLNESS:  This is 50 year old married black female, gravida 3, para 3 with menorrhagia and dysmenorrhea.  She is status post mastectomy for invasive breast cancer in 1999.  She was admitted to undergo total abdominal hysterectomy and bilateral salpingo-oophorectomy.  She underwent this procedure on the morning of July 26, 1999.  There was an estimated blood loss of 300 cc and no complications.  She did have a postoperative epidural for pain relief.  Postoperatively, she did very well.  She had an uneventful postoperative course.  She had her epidural taken out on postoperative day 1 and was sent home.  On postoperative day 3, afebrile and in good condition.  LABORATORY DATA:  Admission H&H was 10 and 33, and on discharge 8.5 and 28.2. Blood type was B positive.  Pathology report came back showing no abnormalities in the uterus.  There was a benign left paratubal cyst.  Uterus was enlarged 151 gm, but no ______ were noted. DD:  08/16/99 TD:  08/16/99 Job: [id number]

## 2010-06-18 NOTE — Op Note (Signed)
Sheryl Landry, Sheryl Landry                          ACCOUNT NO.:  [id number]   MEDICAL RECORD NO.:  [id number]                   PATIENT TYPE:  INP   LOCATION:  0001                                 FACILITY:  [facility]   PHYSICIAN:  Sharlet Salina T. Hoxworth, M.D.          DATE OF BIRTH:  14-Jul-1960   DATE OF PROCEDURE:  10/07/2003  DATE OF DISCHARGE:                                 OPERATIVE REPORT   PREOPERATIVE DIAGNOSIS:  1.  Morbid obesity.  2.  Cholelithiasis.   POSTOPERATIVE DIAGNOSIS:  1.  Morbid obesity.  2.  Cholelithiasis.   SURGICAL PROCEDURE:  1.  Laparoscopic Roux-Y gastric bypass.  2.  Laparoscopic cholecystectomy.   SURGEON:  Dr. Johna Sheriff   ASSISTANT:  Dr. Danna Hefty   ANESTHESIA:  General.   BRIEF HISTORY:  Sheryl Landry is a 50 year old black female with longstanding  morbid obesity, unresponsive to medical attempts at weight loss.  She has  significant comorbidities, adult onset diabetes mellitus, arthritis, and  GERD.  After extensive discussion, work-up detailed elsewhere, we have  elected to proceed with laparoscopic Roux-Y gastric bypass for correction of  her obesity.  On preoperative work-up, she was found to have cholelithiasis  and simultaneous cholecystectomy has been recommend and accepted.  The  nature of the procedure, indications, and multiple risks were discussed  extensively and detailed elsewhere.   DESCRIPTION OF OPERATION:  The patient brought to the operating room and  placed in supine position on the operating table, and general endotracheal  anesthesia was induced.  She had received a mechanical antibiotic bowel  prep.  Antibiotics were given preoperatively.  She received subcutaneous  Lovenox 80 mg preoperatively.  PAS were in place.  The abdomen was widely  sterilely prepped and draped.  Local anesthesia was used to infiltrate the  trocar sites prior to the incisions.  A 1 cm incision was made in the left  subcostal space in the  midclavicular line, and access was obtained without  difficulty using the OptiView.  Trocar and pneumoperitoneum established.  Under direct vision, 12 mm trocars were placed in the left and right mid  abdomen and in the right upper quadrant through the falciform ligament and a  5 mm trocar placed in the left flank.  There were a were a few omental  adhesions to the anterior abdominal wall from previous surgery, and these  were taken down with the harmonic scalpel.  The omentum was then elevated  and the transverse mesocolon elevated and the ligament of Treitz clearly  identified.  A 40 cm efferent limb was measured at which point the bowel was  quite mobile up toward the inferior edge of the liver, and the bowel was  divided at this point with a single firing of the EndoGIA vascular load  stapler.  The mesentery was further divided a short distance with the  harmonic scalpel to allow good mobility of the efferent limb.  The efferent  limb was marked by suturing a Penrose drain to its end.  A 100 cm efferent  limb was then measured.  At this point, a jejunojejunostomy was created with  a single firing of the EndoGIA vascular load stapler, three enterotomies  created with the harmonic scalpel.  The staple was inspected and was found  without bleeding.  The anastomosis appeared widely patent.  The common  enterotomy was closed then with running 2-0 Vicryl, begun at either end of  the enterotomy and tied centrally.  Following this, the mesenteric defect  was closed with running 2-0 silk, hand sewn, carried out onto the  enteroenterostomy.  Staple and suture lines of the jejunojejunostomy were  then coated with Tisseal tissue sealant.  Following this, the patient was  placed in reverse Trendelenburg.  Through a 5 mm trocar site in the  epigastrium, the Nathanson retractor was placed and the right lobe of the  liver elevated with good exposure of the hiatus and fundus.  The fundus was  mobilized  off its attachments of the diaphragm with a harmonic scalpel and  peritoneum over the left crus was incised and the angle of His mobilized.  An approximately 5 cm gastric pouch was measured along the lesser curve.  At  this point, the peritoneum along the lesser curve was incision with the  harmonic scalpel, and dissection was carried along the gastric wall  posteriorly at right angles toward the lesser sac.  After some dissection,  all tubes were removed from the stomach and initial firing of the EndoGIA  blue-load stapler was performed at right angles to the lesser curve.  From  this point, the lesser sac was easily entered and was free up to the angle  of His.  A tubular pouch was created with 3-4 further firings of the EndoGIA  blue load stapler up through the previously dissected area of the angle of  His with complete division of the stomach at this point.  Following this,  the staple line along the gastric remnant was oversewn with running 2-0  Vicryl locking suture.  Also, there were a couple of bleeding points along  the staple line of the gastric pouch right at the lesser curve which were  oversewn with figure-of-eight sutures of 2-0 Vicryl.  The efferent limb was  then brought up to the pouch and reached easily.  A posterior row of running  2-0 Vicryl was placed between the efferent limb and the staple line of the  pouch.  Enterotomies were then made with the harmonic scalpel and the pouch  and the efferent limb, and a 2-2.5 cm anastomosis was created with a single  firing of the EndoGIA blue-load stapler.  The staple line was inspected and  had good hemostasis, and anastomosis appeared perfectly adequate.  Common  enterotomy was then closed with running 2-0 Vicryl, begun at either end of  the enterotomy and tied centrally.  Following this, an Ewald tube was passed  through the anastomosis without difficulty, and an anterior row of running 2- 0 Vicryl suture, hand sewn was placed.   Following this, we attempted to  endoscope the patient, but both Dr. Luan Pulling and I were unable to pass the  video endoscope past the pharynx at about 12 cm, and we elected to forgo  this due to this difficulty.  The staple lines and suture lines of the  gastrojejunostomy were coated with Tisseal tissue sealant.  A closed suction  drain was brought  out through the lateral trocar site and left in the  subhepatic space.  The Nathanson retractor was removed.  The epigastric  trocar was redirected to the right side of the falciform ligament in  preparation for cholecystectomy tube.  Two further 5 mm trocars were placed  in the right flank under direct vision.  The fundus of the gallbladder was  grasped and elevated over the liver and the infundibulum retracted  inferolaterally.  Fibrofatty tissue was stripped off the neck of the  gallbladder toward the porta hepatis and peritoneum anterior and posterior  to Calot's triangle was incised.  The cystic duct was identified and the  cystic duct gallbladder junction was dissected 360 degrees.  The cystic  artery was clearly seen coursing on the gallbladder and Calot's triangle.  The cystic artery was divided between two proximal and distal clips.  The  cystic duct was then clipped at the gallbladder junction.  The cystic duct  was opened in preparation for cholangiogram.  The cystic duct was very  small, actually appeared a little bit smaller than the Cholangiocath which  could not be inserted, and we did not perform a cholangiogram for this  reason.  The cystic duct was then triply clipped proximally and divided.  The gallbladder was dissected free from its bed using a hook cautery and  removed intact.  Hemostasis was assured in the gallbladder bed.  The  gallbladder was placed in an EndoCatch bag and brought out through one of  the 12 mm trocar sites, decompressed, and removed.  The abdomen was again  inspected for hemostasis, and trocar were  removed and all CO2 evacuated.  Skin incisions were closed with staples.  Sponge, needle, and instrument  counts were correct.  Dry sterile dressings were applied and the patient  taken to recovery in satisfactory condition.                                               Lorne Skeens. Hoxworth, M.D.    Tory Emerald  D:  10/07/2003  T:  10/07/2003  Job:  [id number]

## 2010-06-18 NOTE — Op Note (Signed)
[facility]  Patient:    Sheryl Landry, Sheryl Landry                       MRN: [id number] Proc. Date: 07/26/99 Adm. Date:  [id number] Attending:  Brynda Peon CC:         Edwena Felty. Ashley Royalty, M.D.                           Operative Report  PREOPERATIVE DIAGNOSES:  Menorrhagia with anemia and dysmenorrhea.  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  POSTOPERATIVE DIAGNOSES:  Menorrhagia with anemia and dysmenorrhea.  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  PROCEDURE:  Total abdominal hysterectomy, bilateral salpingo-oophorectomy.  SURGEON:  Cynthia P. Ashley Royalty, M.D.  ASSISTANT: Andres Ege, M.D.  ANESTHESIA:  General endotracheal plus epidural for postop pain relief at patient request.  ESTIMATED BLOOD LOSS:  300 cc.  COMPLICATIONS:  None.  DESCRIPTION OF PROCEDURE:  The patient was taken to the recovery room and after the induction of adequate general endotracheal anesthesia, was frog-legged and prepped and draped in usual fashion and Foley catheter inserted.  The legs were then straightened.  An epidural had been placed in the holding room preoperatively for use for postoperative pain relief at the patients request.  A Pfannenstiel incision was made above the previous Pfannenstiel, so as to not have it be underneath the fold of the pannus.  The incision was taken down to the fascia with a deep knife.  The fascia was nicked and opened transversely with Mayos.  Kochers were used to grasp the fascial margins, then it was separated from the underlying rectus muscles with sharp dissection.  The rectus muscles were separated sharply.  The underlying peritoneum was entered atraumatically and opened vertically.  The abdomen was explored.  The liver edge was smooth.  The gallbladder was tense but contained no stones.  There was no adenopathy in the pelvis.  The uterus was of normal size, shape and  contour.  The ovaries were small and normal-appearing, as were the tubes.  The cul-de-sac was free posteriorly and anteriorly.  No evidence of endometriosis.  The uterus was elevated out of the pelvis by grasping at the cornua with long Kellys.  The round ligaments were identified bilaterally, sutured and cut.  Anterior and posterior leaves of the broad ligaments were taken down.  The ureters were identified bilaterally.  The infundibulopelvic ligaments were doubly clamped, cut and doubly tied bilaterally with 0 chromic. The uterine arteries were skeletonized.  The bladder was taken down sharply off the cervix.  The uterine arteries were clamped, cut and doubly tied with 0 chromic on either side.  The procedure continued down the cardinal ligament, clamping with straight Heaneys, cutting and tying.  The uterosacrals were taken with curved Heaneys and were Heaney-sutured and held.  The vagina was entered and the specimen was removed with ______ scissors.  Angle sutures were placed on either side of the vagina, tacking the angles of the vagina to the pedicles of the uterosacral ligaments.  The vaginal cuff was closed with figure-of-eight sutures of 0 chromic.  There was some bleeding encountered underneath the bladder that was controlled both with Bovie and with tying identified bleeders were 3-0 plain tie.  The pelvis was irrigated; hemostasis was noted.  The bowel was allowed to return to its anatomic position.  The peritoneum was closed with  a running suture of 0 chromic, the fascia was closed with 0 Vicryl, going from each angle towards the midline in a running fashion, the subcu tissue was irrigated and hemostasis achieved with a Bovie and the skin was closed with staples.  Patient tolerated it well and went in satisfactory condition to postanesthesia recovery.  Sponge, needle and instrument counts correct x 3. DD:  07/26/99 TD:  07/27/99 Job: [id number]

## 2010-06-18 NOTE — Op Note (Signed)
[name], [name]                ACCOUNT NO.:  [id number]   MEDICAL RECORD NO.:  [id number]          PATIENT TYPE:  AMB   LOCATION:  ENDO                         FACILITY:  [facility]   PHYSICIAN:  Sandria Bales. Ezzard Standing, M.D.  DATE OF BIRTH:  1960/09/16   DATE OF PROCEDURE:  07/16/2004  DATE OF DISCHARGE:                                 OPERATIVE REPORT   PREOPERATIVE DIAGNOSIS:  Right upper quadrant abdominal pain.   POSTOPERATIVE DIAGNOSIS:  Right upper quadrant abdominal pain with normal  esophagus, stomach, gastrojejunal anastomosis, and jejunum.   PROCEDURE:  Esophagogastrojejunoscopy.   SURGEON:  Dr. Ezzard Standing   FIRST ASSISTANT:  None.   ANESTHESIA:  75 mg of Demerol, 10 mg of Versed.   COMPLICATIONS:  None.   INDICATIONS FOR PROCEDURE:  [name] is a 50 year old black female, who  has had a laparoscopic Roux-en-Y gastric bypass by Dr. Jaclynn Guarneri, and she  successfully lost over 100 pounds.  Her weight is approximately 200 pounds  with a BMI of approximately 35, but she has had persistent right upper  quadrant abdominal pain.   She has had at least two CAT scans which have been described as normal, and  she is now coming for endoscopy to make sure there is no problem with either  her anastomosis or pouch.   The indication, potential complications were explained to the patient.  Potential complications include but are not limited to bleeding, perforation  of the bowel.   OPERATIVE NOTE:  [name] is in the left lateral decubitus position.  She  was monitored with a pulse oximetry EKG and blood pressure cuff and was  given 2 L of nasal O2 during the procedure.  I anesthetized the back of the  her throat with Cetacaine then gave her a total of 75 mg of Demerol and 10  mg of Versed.  She never really fell asleep, and she said she is very hard  to sedate and it was impressive that despite 10 mg of Versed, she was still  alert, coherent, and could talk.   I was able to  advance the endoscope down into her stomach pouch.  I then  cannulated both jejunal limbs of the blind pouch and the Roux limb.   I was able to advance the scope to about 65 cm.  This was over 20 cm down  her distal limb.  She has about a 6-7 cm blind pouch, but there was no food  stuck, no rotation or ulcer in this blind limb.  The gastrojejunal  anastomosis was normal without ulcer.  The orifice itself was about 2 cm and  looked very good.  The gastrojejunal anastomosis was at 42 cm.  Her  esophagogastric junction was at 38 cm for a 4 cm pouch.  The mucosa of the  stomach looked normal.  I did not do a CLOtest in that there really was no  evidence of gastritis, inflammation, or anything.  Her esophagus looked  normal.  Her exam was  normal  with a nice postoperative pouch and  anastomosis.   I talked  to [name] at the end of the procedure.  She is going to be in  touch with Dr. Johna Sheriff for further discussions and plans.       DHN/MEDQ  D:  07/16/2004  T:  07/16/2004  Job:  [id number]   cc:   Lorne Skeens. Hoxworth, M.D.  [address]  [place]  [place] [address]   C. Duane Lope, M.D.  [address]  [place]  [place] [address]  Fax: [phone]

## 2010-06-18 NOTE — Discharge Summary (Signed)
Sheryl Landry, Sheryl Landry                          ACCOUNT NO.:  [id number]   MEDICAL RECORD NO.:  [id number]                   PATIENT TYPE:  INP   LOCATION:  0454                                 FACILITY:  [facility]   PHYSICIAN:  Sharlet Salina T. Hoxworth, M.D.          DATE OF BIRTH:  November 11, 1960   DATE OF ADMISSION:  10/07/2003  DATE OF DISCHARGE:  10/10/2003                                 DISCHARGE SUMMARY   DISCHARGE DIAGNOSES:  1.  Morbid obesity.  2.  Adult-onset diabetes mellitus.  3.  Arthritis.  4.  Gastroesophageal reflux disease.  5.  Cholelithiasis.   OPERATIVE PROCEDURES:  1.  Laparoscopic Roux-en-Y bypass, October 07, 2003.  2.  Cholecystectomy.   HISTORY OF PRESENT ILLNESS:  Sheryl Landry is a 50 year old black female with  a lifelong history of progressive obesity unresponsive to medical  management.  After a thorough workup and discussions of __________ , we have  elected to proceed with laparoscopic Roux-en-Y for correction of her morbid  obesity.  She has comorbidities, including adult-onset diabetes mellitus,  arthritis with chronic joint pain, and GERD. She is admitted on the morning  of procedure.   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  No  evidence of recurrence.  She has had bilateral knee arthroscopy and  hysterectomy through a Pfannenstiel incision.  Medically, she is followed  for diabetes, elevated cholesterol, mild depression.   MEDICATIONS:  1.  Avandia 4 mg a day.  2.  Lipitor 40 mg daily.  3.  Nexium 40 mg daily.  4.  Prozac 40 mg daily.  5.  Ambien 10 mg q.h.s.   No known drug allergies.   SOCIAL HISTORY:  See admission H&P.   FAMILY HISTORY:  See admission H&P.   REVIEW OF SYSTEMS:  See admission H&P.   PHYSICAL EXAMINATION:  Weight is 278.  BMI 49.29.  Exam was significant only  for morbid obesity and status post mastectomy with no  evidence of  recurrence.   HOSPITAL COURSE:  Patient was admitted on the morning of the procedure and  underwent an uneventful laparoscopic Roux-en-Y gastric bypass.  She also  underwent simultaneous cholecystectomy for stones.  She tolerated the  procedure well.  The first postoperative day, she underwent a barium swallow  that showed no evidence of leak or obstruction and was started on limited  clear liquids.  Hemoglobin decreased moderately to 9.3.  Vital signs  remained stable.  She tolerated liquids well and was advanced to unlimited  clears on the second postoperative day.  Hemoglobin was stable at 9.2.  White count normal.  On the third postoperative day, she was tolerating full  liquids.  J-P drainage was minimal.  She denied pain and nausea.  Abdomen  was benign.  She was felt ready for discharge.   DISCHARGE MEDICATIONS:  The same  as admission plus Roxicet elixir for pain.   FOLLOW UP:  In my office in one week.      BTH/MEDQ  D:  10/20/2003  T:  10/21/2003  Job:  [id number]   cc:   C. Duane Lope, M.D.  [address]  [place]  [place] [address]  Fax: [phone]

## 2010-08-25 ENCOUNTER — Other Ambulatory Visit (INDEPENDENT_AMBULATORY_CARE_PROVIDER_SITE_OTHER): Payer: Self-pay | Admitting: General Surgery

## 2010-08-25 DIAGNOSIS — Z9010 Acquired absence of unspecified breast and nipple: Secondary | ICD-10-CM

## 2010-08-26 ENCOUNTER — Ambulatory Visit
Admission: RE | Admit: 2010-08-26 | Discharge: 2010-08-26 | Disposition: A | Discharge status: Home / Self Care | Payer: 59 | Source: Ambulatory Visit | Attending: General Surgery | Admitting: General Surgery

## 2010-08-26 DIAGNOSIS — Z9010 Acquired absence of unspecified breast and nipple: Secondary | ICD-10-CM

## 2010-10-25 LAB — COMPREHENSIVE METABOLIC PANEL
ALT: 44 — ABNORMAL HIGH
AST: 39 — ABNORMAL HIGH
Albumin: 3.8
Alkaline Phosphatase: 108
BUN: 15
CO2: 26
Calcium: 8.4
Chloride: 105
Creatinine, Ser: 0.59
GFR calc Af Amer: >60
GFR calc non Af Amer: >60
Glucose, Bld: 103 — ABNORMAL HIGH
Potassium: 4.2
Sodium: 138
Total Bilirubin: 0.3
Total Protein: 6.9

## 2010-10-25 LAB — URINALYSIS, ROUTINE W REFLEX MICROSCOPIC
Bilirubin Urine: NEGATIVE
Glucose, UA: NEGATIVE
Hgb urine dipstick: NEGATIVE
Ketones, ur: NEGATIVE
Nitrite: NEGATIVE
Protein, ur: NEGATIVE
Specific Gravity, Urine: 1.028
Urobilinogen, UA: 1
pH: 6.5

## 2010-10-25 LAB — CBC
HCT: 35 — ABNORMAL LOW
Hemoglobin: 11.6 — ABNORMAL LOW
MCHC: 33
MCV: 83.8
Platelets: 263
RBC: 4.18
RDW: 13.9
WBC: 6

## 2010-10-25 LAB — DIFFERENTIAL
Basophils Absolute: 0
Basophils Relative: 0
Eosinophils Absolute: 0.1
Eosinophils Relative: 2
Lymphocytes Relative: 29
Lymphs Abs: 1.8
Monocytes Absolute: 0.2
Monocytes Relative: 4
Neutro Abs: 3.9
Neutrophils Relative %: 64

## 2010-10-25 LAB — LIPASE, BLOOD: Lipase: 19

## 2010-11-08 LAB — CREATININE, SERUM
Creatinine, Ser: 0.69
GFR calc Af Amer: >60
GFR calc non Af Amer: >60

## 2010-11-11 LAB — RAPID URINE DRUG SCREEN, HOSP PERFORMED
Amphetamines: NOT DETECTED
Barbiturates: NOT DETECTED
Benzodiazepines: NOT DETECTED
Cocaine: NOT DETECTED
Opiates: NOT DETECTED
Tetrahydrocannabinol: NOT DETECTED

## 2010-11-11 LAB — PREGNANCY, URINE: Preg Test, Ur: NEGATIVE

## 2010-11-11 LAB — DIFFERENTIAL
Basophils Absolute: 0
Basophils Relative: 0
Eosinophils Absolute: 0.1
Eosinophils Relative: 2
Lymphocytes Relative: 55 — ABNORMAL HIGH
Lymphs Abs: 3.4 — ABNORMAL HIGH
Monocytes Absolute: 0.3
Monocytes Relative: 5
Neutro Abs: 2.3
Neutrophils Relative %: 37 — ABNORMAL LOW

## 2010-11-11 LAB — COMPREHENSIVE METABOLIC PANEL
ALT: 46 — ABNORMAL HIGH
AST: 44 — ABNORMAL HIGH
Albumin: 3.8
Alkaline Phosphatase: 92
BUN: 9
CO2: 27
Calcium: 8.6
Chloride: 103
Creatinine, Ser: 0.57
GFR calc Af Amer: >60
GFR calc non Af Amer: >60
Glucose, Bld: 75
Potassium: 4.1
Sodium: 135
Total Bilirubin: 0.4
Total Protein: 7.1

## 2010-11-11 LAB — URINALYSIS, ROUTINE W REFLEX MICROSCOPIC
Bilirubin Urine: NEGATIVE
Glucose, UA: NEGATIVE
Hgb urine dipstick: NEGATIVE
Ketones, ur: NEGATIVE
Nitrite: NEGATIVE
Protein, ur: NEGATIVE
Specific Gravity, Urine: 1.023
Urobilinogen, UA: 1
pH: 6.5

## 2010-11-11 LAB — CBC
HCT: 37.4
Hemoglobin: 12.3
MCHC: 33
MCV: 88.5
Platelets: 327
RBC: 4.23
RDW: 13.8
WBC: 6.2

## 2010-11-11 LAB — ETHANOL: Alcohol, Ethyl (B): <5

## 2010-11-11 LAB — LIPASE, BLOOD: Lipase: 21

## 2011-04-01 ENCOUNTER — Ambulatory Visit (INDEPENDENT_AMBULATORY_CARE_PROVIDER_SITE_OTHER): Payer: Self-pay | Admitting: General Surgery

## 2011-11-15 ENCOUNTER — Other Ambulatory Visit (INDEPENDENT_AMBULATORY_CARE_PROVIDER_SITE_OTHER): Payer: Self-pay | Admitting: General Surgery

## 2012-05-17 ENCOUNTER — Other Ambulatory Visit: Payer: Self-pay

## 2012-05-17 DIAGNOSIS — Z1231 Encounter for screening mammogram for malignant neoplasm of breast: Secondary | ICD-10-CM

## 2012-05-17 DIAGNOSIS — Z9012 Acquired absence of left breast and nipple: Secondary | ICD-10-CM

## 2012-06-09 ENCOUNTER — Telehealth (INDEPENDENT_AMBULATORY_CARE_PROVIDER_SITE_OTHER): Payer: Self-pay | Admitting: Surgery

## 2012-06-09 NOTE — Telephone Encounter (Signed)
Patient returned call.    Patient notes she is having strong abdominal pains.  She is status post a laparoscopic Roux-en-Y gastric bypass and cholecystectomy in 2005 with Dr. Johna Sheriff.  She is gone intermittent abdominal pains since that time.  In 2007 workup was suspicious.  She underwent diagnostic laparoscopy and repair of an internal hernia.  She had pains in 2008 with negative endoscopy.  She had pains in 2011 with a negative workup.  No problems for the past few years.  Has had progressive weight loss over the past year or so..  She does feel like she had sharp abdominal pain for the past few days.  She can eat and drink but it hurts afterwards.  She is concerned and called [place] tonight, Saturday night.  I recommended she go to the emergency room for evaluation.   Probably would need IV fluids and a CAT scan to rule out a problem.  She was hoping Dr. Johna Sheriff was on call tonight to take care of this.  I noted he was not available for the next few days but his partners, such as myself, are.  I tried to reassure her that she is unlikely to get a second internal hernia, but we must be safe and evaluate.  She seemed a little disappointed but said thank you and hung up.

## 2012-06-09 NOTE — Telephone Encounter (Signed)
Patient called.  Will not answer phone.  Called x2.

## 2012-06-22 ENCOUNTER — Ambulatory Visit: Payer: 59

## 2012-07-27 ENCOUNTER — Ambulatory Visit: Payer: 59

## 2012-09-17 ENCOUNTER — Other Ambulatory Visit: Payer: Self-pay | Admitting: Gastroenterology

## 2012-10-08 ENCOUNTER — Encounter (INDEPENDENT_AMBULATORY_CARE_PROVIDER_SITE_OTHER): Payer: Self-pay

## 2012-10-30 ENCOUNTER — Encounter (INDEPENDENT_AMBULATORY_CARE_PROVIDER_SITE_OTHER): Payer: Self-pay

## 2013-09-16 ENCOUNTER — Encounter (HOSPITAL_COMMUNITY): Payer: Self-pay | Admitting: Emergency Medicine

## 2013-09-16 ENCOUNTER — Emergency Department (HOSPITAL_COMMUNITY)
Admission: EM | Admit: 2013-09-16 | Discharge: 2013-09-17 | Disposition: A | Discharge status: Other Acute Inpatient Hospital | Payer: 59 | Attending: Emergency Medicine | Admitting: Emergency Medicine

## 2013-09-16 DIAGNOSIS — Z9289 Personal history of other medical treatment: Secondary | ICD-10-CM

## 2013-09-16 DIAGNOSIS — F17200 Nicotine dependence, unspecified, uncomplicated: Secondary | ICD-10-CM | POA: Insufficient documentation

## 2013-09-16 DIAGNOSIS — Z79899 Other long term (current) drug therapy: Secondary | ICD-10-CM | POA: Insufficient documentation

## 2013-09-16 DIAGNOSIS — Z046 Encounter for general psychiatric examination, requested by authority: Secondary | ICD-10-CM | POA: Diagnosis present

## 2013-09-16 DIAGNOSIS — F1010 Alcohol abuse, uncomplicated: Secondary | ICD-10-CM | POA: Insufficient documentation

## 2013-09-16 DIAGNOSIS — E119 Type 2 diabetes mellitus without complications: Secondary | ICD-10-CM | POA: Insufficient documentation

## 2013-09-16 DIAGNOSIS — Z09 Encounter for follow-up examination after completed treatment for conditions other than malignant neoplasm: Secondary | ICD-10-CM

## 2013-09-16 DIAGNOSIS — Z853 Personal history of malignant neoplasm of breast: Secondary | ICD-10-CM | POA: Diagnosis not present

## 2013-09-16 HISTORY — DX: Malignant (primary) neoplasm, unspecified: C80.1

## 2013-09-16 HISTORY — DX: Type 2 diabetes mellitus without complications: E11.9

## 2013-09-16 LAB — RAPID URINE DRUG SCREEN, HOSP PERFORMED
Amphetamines: NOT DETECTED
Barbiturates: NOT DETECTED
Benzodiazepines: NOT DETECTED
Cocaine: NOT DETECTED
Opiates: NOT DETECTED
Tetrahydrocannabinol: NOT DETECTED

## 2013-09-16 LAB — CBC WITH DIFFERENTIAL/PLATELET
Basophils Absolute: 0 10*3/uL (ref 0.0–0.1)
Basophils Relative: 0 % (ref 0–1)
Eosinophils Absolute: 0.1 10*3/uL (ref 0.0–0.7)
Eosinophils Relative: 2 % (ref 0–5)
HCT: 38 % (ref 36.0–46.0)
Hemoglobin: 12.6 g/dL (ref 12.0–15.0)
Lymphocytes Relative: 70 % — ABNORMAL HIGH (ref 12–46)
Lymphs Abs: 3.8 10*3/uL (ref 0.7–4.0)
MCH: 27.9 pg (ref 26.0–34.0)
MCHC: 33.2 g/dL (ref 30.0–36.0)
MCV: 84.1 fL (ref 78.0–100.0)
Monocytes Absolute: 0.2 10*3/uL (ref 0.1–1.0)
Monocytes Relative: 4 % (ref 3–12)
Neutro Abs: 1.3 10*3/uL — ABNORMAL LOW (ref 1.7–7.7)
Neutrophils Relative %: 24 % — ABNORMAL LOW (ref 43–77)
Platelets: 329 10*3/uL (ref 150–400)
RBC: 4.52 MIL/uL (ref 3.87–5.11)
RDW: 14.2 % (ref 11.5–15.5)
WBC: 5.4 10*3/uL (ref 4.0–10.5)

## 2013-09-16 LAB — BASIC METABOLIC PANEL
Anion gap: 19 — ABNORMAL HIGH (ref 5–15)
BUN: 17 mg/dL (ref 6–23)
CO2: 24 mEq/L (ref 19–32)
Calcium: 9.3 mg/dL (ref 8.4–10.5)
Chloride: 103 mEq/L (ref 96–112)
Creatinine, Ser: 0.87 mg/dL (ref 0.50–1.10)
GFR calc Af Amer: 88 mL/min — ABNORMAL LOW (ref >90)
GFR calc non Af Amer: 76 mL/min — ABNORMAL LOW (ref >90)
Glucose, Bld: 107 mg/dL — ABNORMAL HIGH (ref 70–99)
Potassium: 4.5 mEq/L (ref 3.7–5.3)
Sodium: 146 mEq/L (ref 137–147)

## 2013-09-16 LAB — URINALYSIS, ROUTINE W REFLEX MICROSCOPIC
Bilirubin Urine: NEGATIVE
Glucose, UA: NEGATIVE mg/dL
Hgb urine dipstick: NEGATIVE
Ketones, ur: NEGATIVE mg/dL
Leukocytes, UA: NEGATIVE
Nitrite: NEGATIVE
Protein, ur: NEGATIVE mg/dL
Specific Gravity, Urine: 1.025 (ref 1.005–1.030)
Urobilinogen, UA: 0.2 mg/dL (ref 0.0–1.0)
pH: 5 (ref 5.0–8.0)

## 2013-09-16 LAB — ETHANOL: Alcohol, Ethyl (B): 265 mg/dL — ABNORMAL HIGH (ref 0–11)

## 2013-09-16 MED ORDER — LORAZEPAM 2 MG/ML IJ SOLN: 0.0000 mg | Freq: Four times a day (QID) | INTRAMUSCULAR | Status: DC

## 2013-09-16 MED ORDER — LORAZEPAM 1 MG PO TABS
1.0000 mg | ORAL_TABLET | Freq: Three times a day (TID) | ORAL | Status: DC | PRN
  Administered 2013-09-17: 1 mg via ORAL
  Filled 2013-09-16 (×2): qty 1

## 2013-09-16 MED ORDER — NICOTINE 21 MG/24HR TD PT24
21.0000 mg | MEDICATED_PATCH | Freq: Every day | TRANSDERMAL | Status: DC
Start: 2013-09-17 — End: 2013-09-17

## 2013-09-16 MED ORDER — ZIPRASIDONE MESYLATE 20 MG IM SOLR
20.0000 mg | Freq: Once | INTRAMUSCULAR | Status: AC
  Administered 2013-09-16: 20 mg via INTRAMUSCULAR
  Filled 2013-09-16: qty 20

## 2013-09-16 MED ORDER — LORAZEPAM 1 MG PO TABS: 0.0000 mg | ORAL_TABLET | Freq: Two times a day (BID) | ORAL | Status: DC

## 2013-09-16 MED ORDER — IBUPROFEN 200 MG PO TABS: 600.0000 mg | ORAL_TABLET | Freq: Three times a day (TID) | ORAL | Status: DC | PRN

## 2013-09-16 MED ORDER — VITAMIN B-1 100 MG PO TABS
100.0000 mg | ORAL_TABLET | Freq: Every day | ORAL | Status: DC
  Administered 2013-09-17: 100 mg via ORAL
  Filled 2013-09-16: qty 1

## 2013-09-16 MED ORDER — ONDANSETRON HCL 4 MG PO TABS: 4.0000 mg | ORAL_TABLET | Freq: Three times a day (TID) | ORAL | Status: DC | PRN

## 2013-09-16 MED ORDER — SULFAMETHOXAZOLE-TMP DS 800-160 MG PO TABS: 1.0000 | ORAL_TABLET | Freq: Once | ORAL | Status: DC

## 2013-09-16 MED ORDER — STERILE WATER FOR INJECTION IJ SOLN
INTRAMUSCULAR | Status: AC
  Administered 2013-09-16: 1.9 mL
  Filled 2013-09-16: qty 10

## 2013-09-16 MED ORDER — LORAZEPAM 1 MG PO TABS
0.0000 mg | ORAL_TABLET | Freq: Four times a day (QID) | ORAL | Status: DC
  Administered 2013-09-17: 1 mg via ORAL
  Filled 2013-09-16: qty 1

## 2013-09-16 MED ORDER — ACETAMINOPHEN 325 MG PO TABS
650.0000 mg | ORAL_TABLET | ORAL | Status: DC | PRN
  Administered 2013-09-16: 650 mg via ORAL
  Filled 2013-09-16: qty 2

## 2013-09-16 MED ORDER — LORAZEPAM 2 MG/ML IJ SOLN: 0.0000 mg | Freq: Two times a day (BID) | INTRAMUSCULAR | Status: DC

## 2013-09-16 MED ORDER — ZOLPIDEM TARTRATE 5 MG PO TABS
5.0000 mg | ORAL_TABLET | Freq: Every evening | ORAL | Status: DC | PRN
  Administered 2013-09-16: 5 mg via ORAL
  Filled 2013-09-16: qty 1

## 2013-09-16 MED ORDER — ALUM & MAG HYDROXIDE-SIMETH 200-200-20 MG/5ML PO SUSP: 30.0000 mL | ORAL | Status: DC | PRN

## 2013-09-16 MED ORDER — THIAMINE HCL 100 MG/ML IJ SOLN: 100.0000 mg | Freq: Every day | INTRAMUSCULAR | Status: DC

## 2013-09-16 NOTE — Progress Notes (Addendum)
Per Patriciaann Clan the patient will remain in the ED until her BAL is lowered.  Per Patriciaann Clan the patient will then be re-assessed by the Psychiatrist.  The psychiatrist will rescind or uphold the IVC.  The patient will need to have the First Exam completed by the Psychiatrist tomorrow before 4pm.  Writer informed the ER MD Dr. Mingo Amber and the nurse of the patients disposition.   The patient presented to the ED with a BAL of 265.  No UDS in the chart.

## 2013-09-16 NOTE — ED Notes (Signed)
Patient transferred from Triage.  Belongings inventoried and placed in locker number 34.  She received Geodon IM prior to coming back to the unit.  She is calm and cooperative at this time.  Requested to have TV turned on.  She is currently resting quietly in her bed.

## 2013-09-16 NOTE — ED Notes (Signed)
Bed: [facility] Expected date:  Expected time:  Means of arrival:  Comments: EMS/etoh/with police

## 2013-09-16 NOTE — ED Notes (Addendum)
Transported from [place] via [place] with IVC.  Here for medical clearance, pt is loud and aggressive.  Cursing at staff and sheriff.  Per IVC papers, pt is abusing alcohol, pt's husband reports pt has verbalized taking a gun to her head.  Pt with hx of depression

## 2013-09-16 NOTE — ED Notes (Signed)
Patient cooperative at present. Continues to deny SI, HI, AVH. Denies alcoholism is an issue for her.  Encouragement offered. Given Tylenol, Ambien.  Q 15 checks continue.

## 2013-09-16 NOTE — ED Notes (Addendum)
Patient alert. Denies SI, HI. States "I am not crazy. I don't know why they are trying to say I would hurt myself". Patient notified that her husband and daughter have called. Patient reports that she has DM coved by Metformin at home.  Encouragement offered.   Q 15 safety checks continue.

## 2013-09-16 NOTE — ED Notes (Signed)
Pt had blue jeans pink shirt white flower bra 2 white color toe rings 2 yellow colo bead bractlets white flip flops

## 2013-09-16 NOTE — BH Assessment (Signed)
Assessment Note  Sheryl Landry is an 53 y.o. female with SI.  The patient was brought in by police with IVC paperwork by the patients family. Patient heavily intoxicated. Apparently making threats to shoot herself in the head. Patient is extremely loud and agitated. Alcoholic rage. Inappropriate comments. Documentation in epic reports that the patient was cursing at staff and sheriff. Per IVC papers, pt is abusing alcohol, pt's husband reports pt has verbalized taking a gun to her head. Pt with hx of depression.  She received Geodon IM prior to coming back to the unit.    Axis I: Major Depression, Recurrent severe Axis II: Deferred Axis III:  Past Medical History  Diagnosis Date  . Diabetes mellitus without complication   . Cancer     breast   Axis IV: economic problems, housing problems, occupational problems, other psychosocial or environmental problems, problems related to social environment and problems with primary support group Axis V: 41-50 serious symptoms  Past Medical History:  Past Medical History  Diagnosis Date  . Diabetes mellitus without complication   . Cancer     breast    Past Surgical History  Procedure Laterality Date  . Gastric bypass      Family History: No family history on file.  Social History:  reports that she has been smoking.  She does not have any smokeless tobacco history on file. She reports that she drinks alcohol. She reports that she does not use illicit drugs.  Additional Social History:     CIWA: CIWA-Ar BP: 115/75 mmHg Pulse Rate: 82 Nausea and Vomiting: no nausea and no vomiting Tactile Disturbances: none Tremor: no tremor Auditory Disturbances: not present Paroxysmal Sweats: no sweat visible Visual Disturbances: not present Anxiety: no anxiety, at ease Headache, Fullness in Head: mild Agitation: somewhat more than normal activity Orientation and Clouding of Sensorium: oriented and can do serial additions CIWA-Ar Total:  3 COWS:    Allergies: No Known Allergies  Home Medications:  (Not in a hospital admission)  OB/GYN Status:  No LMP recorded. Patient has had a hysterectomy.  General Assessment Data Location of Assessment: [facility] Is this a Tele or Face-to-Face Assessment?: Face-to-Face Is this an Initial Assessment or a Re-assessment for this encounter?: Initial Assessment Living Arrangements: Spouse/significant other Can pt return to current living arrangement?: Yes Admission Status: Voluntary Is patient capable of signing voluntary admission?: Yes Transfer from: [facility] Referral Source: Other  Medical Screening Exam ([place]) Medical Exam completed: Yes  [place] Plan Living Arrangements: Spouse/significant other Name of Psychiatrist: None Reported Name of Therapist: None Reported  Education Status Is patient currently in school?: No Current Grade: na Highest grade of school patient has completed: na Name of school: na Contact person: na  Risk to self with the past 6 months Suicidal Ideation: No Suicidal Intent: No Is patient at risk for suicide?: No Suicidal Plan?: No Access to Means: No What has been your use of drugs/alcohol within the last 12 months?: Alcohol Previous Attempts/Gestures: No How many times?: 0 Other Self Harm Risks: None Reported Triggers for Past Attempts:  (None Reported) Intentional Self Injurious Behavior: None Family Suicide History: No Recent stressful life event(s):  (None Reported) Persecutory voices/beliefs?: No Depression: Yes Depression Symptoms: Loss of interest in usual pleasures;Feeling worthless/self pity;Feeling angry/irritable Substance abuse history and/or treatment for substance abuse?: Yes (Alcohol) Suicide prevention information given to non-admitted patients: Yes  Risk to Others within the past 6 months Homicidal Ideation: No Thoughts of  Harm to Others: No-Not Currently Present/Within Last 6 Months Current  Homicidal Intent: No Current Homicidal Plan: No Access to Homicidal Means: No Identified Victim: None Reported History of harm to others?: No Assessment of Violence: None Noted Violent Behavior Description: None Reporrd Does patient have access to weapons?: Yes (Comment) (Her husband owns a gun.) Criminal Charges Pending?: No Does patient have a court date: No  Psychosis Hallucinations: None noted Delusions: None noted  Mental Status Report Appear/Hygiene: Disheveled Eye Contact: Poor Motor Activity: Freedom of movement Speech: Logical/coherent Level of Consciousness: Alert Mood: Depressed;Anxious Affect: Anxious;Blunted Anxiety Level: Minimal Thought Processes: Coherent;Relevant Judgement: Unimpaired Orientation: Person;Place;Time;Situation Obsessive Compulsive Thoughts/Behaviors: None  Cognitive Functioning Concentration: Decreased Memory: Recent Intact;Remote Intact IQ: Average Insight: Fair Impulse Control: Poor Appetite: Fair Weight Loss: 0 Weight Gain: 0 Sleep: Decreased Total Hours of Sleep: 4 Vegetative Symptoms: Decreased grooming  ADLScreening [facility] Assessment Services) Patient's cognitive ability adequate to safely complete daily activities?: Yes Patient able to express need for assistance with ADLs?: Yes Independently performs ADLs?: Yes (appropriate for developmental age)  Prior Inpatient Therapy Prior Inpatient Therapy: No Prior Therapy Dates: NA Prior Therapy Facilty/Provider(s): NA Reason for Treatment: NA  Prior Outpatient Therapy Prior Outpatient Therapy: No Prior Therapy Dates: NA Prior Therapy Facilty/Provider(s): NA Reason for Treatment: NA  ADL Screening (condition at time of admission) Patient's cognitive ability adequate to safely complete daily activities?: Yes Patient able to express need for assistance with ADLs?: Yes Independently performs ADLs?: Yes (appropriate for developmental age)         Values / Beliefs Cultural  Requests During Hospitalization: None Spiritual Requests During Hospitalization: None        Additional Information 1:1 In Past 12 Months?: No CIRT Risk: No Elopement Risk: No Does patient have medical clearance?: Yes     Disposition:  Disposition Initial Assessment Completed for this Encounter: Yes Disposition of Patient: Other dispositions Other disposition(s): Other (Comment)  On Site Evaluation by:   Reviewed with Physician:    Graciella Freer [place] 09/16/2013 10:40 PM

## 2013-09-17 ENCOUNTER — Encounter (HOSPITAL_COMMUNITY): Payer: Self-pay | Admitting: Registered Nurse

## 2013-09-17 ENCOUNTER — Encounter (HOSPITAL_COMMUNITY): Payer: Self-pay

## 2013-09-17 ENCOUNTER — Inpatient Hospital Stay (HOSPITAL_COMMUNITY)
Admission: EM | Admit: 2013-09-17 | Discharge: 2013-09-23 | DRG: 897 | Disposition: A | Discharge status: Home / Self Care | Payer: Federal, State, Local not specified - Other | Source: Intra-hospital | Attending: Psychiatry | Admitting: Psychiatry

## 2013-09-17 DIAGNOSIS — Z5989 Other problems related to housing and economic circumstances: Secondary | ICD-10-CM

## 2013-09-17 DIAGNOSIS — F1010 Alcohol abuse, uncomplicated: Secondary | ICD-10-CM | POA: Diagnosis present

## 2013-09-17 DIAGNOSIS — F17200 Nicotine dependence, unspecified, uncomplicated: Secondary | ICD-10-CM | POA: Diagnosis present

## 2013-09-17 DIAGNOSIS — F1020 Alcohol dependence, uncomplicated: Principal | ICD-10-CM | POA: Diagnosis present

## 2013-09-17 DIAGNOSIS — Z9884 Bariatric surgery status: Secondary | ICD-10-CM

## 2013-09-17 DIAGNOSIS — Z598 Other problems related to housing and economic circumstances: Secondary | ICD-10-CM

## 2013-09-17 DIAGNOSIS — F410 Panic disorder [episodic paroxysmal anxiety] without agoraphobia: Secondary | ICD-10-CM | POA: Diagnosis present

## 2013-09-17 DIAGNOSIS — F411 Generalized anxiety disorder: Secondary | ICD-10-CM | POA: Diagnosis present

## 2013-09-17 DIAGNOSIS — Z833 Family history of diabetes mellitus: Secondary | ICD-10-CM | POA: Diagnosis not present

## 2013-09-17 DIAGNOSIS — F1994 Other psychoactive substance use, unspecified with psychoactive substance-induced mood disorder: Secondary | ICD-10-CM | POA: Diagnosis present

## 2013-09-17 DIAGNOSIS — E119 Type 2 diabetes mellitus without complications: Secondary | ICD-10-CM | POA: Diagnosis present

## 2013-09-17 DIAGNOSIS — Z9289 Personal history of other medical treatment: Secondary | ICD-10-CM

## 2013-09-17 DIAGNOSIS — Z5987 Material hardship due to limited financial resources, not elsewhere classified: Secondary | ICD-10-CM

## 2013-09-17 DIAGNOSIS — Z9010 Acquired absence of unspecified breast and nipple: Secondary | ICD-10-CM

## 2013-09-17 DIAGNOSIS — G4700 Insomnia, unspecified: Secondary | ICD-10-CM | POA: Diagnosis present

## 2013-09-17 DIAGNOSIS — R45850 Homicidal ideations: Secondary | ICD-10-CM | POA: Diagnosis not present

## 2013-09-17 DIAGNOSIS — F332 Major depressive disorder, recurrent severe without psychotic features: Secondary | ICD-10-CM | POA: Diagnosis present

## 2013-09-17 DIAGNOSIS — Z09 Encounter for follow-up examination after completed treatment for conditions other than malignant neoplasm: Secondary | ICD-10-CM

## 2013-09-17 DIAGNOSIS — F419 Anxiety disorder, unspecified: Secondary | ICD-10-CM

## 2013-09-17 DIAGNOSIS — R45851 Suicidal ideations: Secondary | ICD-10-CM | POA: Diagnosis not present

## 2013-09-17 DIAGNOSIS — Z853 Personal history of malignant neoplasm of breast: Secondary | ICD-10-CM | POA: Diagnosis not present

## 2013-09-17 LAB — CBG MONITORING, ED
Glucose-Capillary: 102 mg/dL — ABNORMAL HIGH (ref 70–99)
Glucose-Capillary: 69 mg/dL — ABNORMAL LOW (ref 70–99)

## 2013-09-17 MED ORDER — THIAMINE HCL 100 MG/ML IJ SOLN: 100.0000 mg | Freq: Every day | INTRAMUSCULAR | Status: DC

## 2013-09-17 MED ORDER — IBUPROFEN 200 MG PO TABS
600.0000 mg | ORAL_TABLET | Freq: Four times a day (QID) | ORAL | Status: DC | PRN
  Administered 2013-09-17 – 2013-09-19 (×3): 600 mg via ORAL
  Filled 2013-09-17 (×2): qty 1
  Filled 2013-09-17: qty 3

## 2013-09-17 MED ORDER — METFORMIN HCL 500 MG PO TABS
500.0000 mg | ORAL_TABLET | Freq: Every day | ORAL | Status: DC
  Filled 2013-09-17: qty 1

## 2013-09-17 MED ORDER — MAGNESIUM HYDROXIDE 400 MG/5ML PO SUSP: 30.0000 mL | Freq: Every day | ORAL | Status: DC | PRN

## 2013-09-17 MED ORDER — QUETIAPINE FUMARATE 100 MG PO TABS
200.0000 mg | ORAL_TABLET | Freq: Every day | ORAL | Status: DC
  Administered 2013-09-17: 200 mg via ORAL
  Filled 2013-09-17: qty 2

## 2013-09-17 MED ORDER — AMITRIPTYLINE HCL 25 MG PO TABS
25.0000 mg | ORAL_TABLET | Freq: Every day | ORAL | Status: DC
  Administered 2013-09-18 – 2013-09-19 (×2): 25 mg via ORAL
  Filled 2013-09-17 (×5): qty 1

## 2013-09-17 MED ORDER — VENLAFAXINE HCL ER 150 MG PO CP24
150.0000 mg | ORAL_CAPSULE | Freq: Two times a day (BID) | ORAL | Status: DC
  Administered 2013-09-17: 150 mg via ORAL
  Filled 2013-09-17 (×2): qty 1

## 2013-09-17 MED ORDER — NICOTINE 21 MG/24HR TD PT24
21.0000 mg | MEDICATED_PATCH | Freq: Every day | TRANSDERMAL | Status: DC
  Filled 2013-09-17 (×10): qty 1

## 2013-09-17 MED ORDER — VITAMIN B-1 100 MG PO TABS
100.0000 mg | ORAL_TABLET | Freq: Every day | ORAL | Status: DC
  Administered 2013-09-18 – 2013-09-23 (×6): 100 mg via ORAL
  Filled 2013-09-17 (×10): qty 1

## 2013-09-17 MED ORDER — LORAZEPAM 1 MG PO TABS
0.0000 mg | ORAL_TABLET | Freq: Four times a day (QID) | ORAL | Status: AC
  Administered 2013-09-17: 2 mg via ORAL
  Administered 2013-09-17 – 2013-09-18 (×3): 1 mg via ORAL
  Filled 2013-09-17 (×3): qty 1
  Filled 2013-09-17: qty 2
  Filled 2013-09-17 (×2): qty 1

## 2013-09-17 MED ORDER — DOXEPIN HCL 25 MG PO CAPS
25.0000 mg | ORAL_CAPSULE | Freq: Every evening | ORAL | Status: DC | PRN
  Administered 2013-09-17 – 2013-09-18 (×3): 25 mg via ORAL
  Filled 2013-09-17 (×9): qty 1

## 2013-09-17 MED ORDER — VENLAFAXINE HCL ER 150 MG PO CP24
150.0000 mg | ORAL_CAPSULE | Freq: Two times a day (BID) | ORAL | Status: DC
  Administered 2013-09-17 – 2013-09-20 (×6): 150 mg via ORAL
  Filled 2013-09-17 (×12): qty 1

## 2013-09-17 MED ORDER — LORAZEPAM 2 MG/ML IJ SOLN: 0.0000 mg | Freq: Two times a day (BID) | INTRAMUSCULAR | Status: DC

## 2013-09-17 MED ORDER — ONDANSETRON HCL 4 MG PO TABS
4.0000 mg | ORAL_TABLET | Freq: Three times a day (TID) | ORAL | Status: DC | PRN
  Administered 2013-09-18 – 2013-09-19 (×3): 4 mg via ORAL
  Filled 2013-09-17 (×3): qty 1

## 2013-09-17 MED ORDER — LORAZEPAM 2 MG/ML IJ SOLN: 0.0000 mg | Freq: Four times a day (QID) | INTRAMUSCULAR | Status: AC

## 2013-09-17 MED ORDER — AMITRIPTYLINE HCL 25 MG PO TABS
25.0000 mg | ORAL_TABLET | Freq: Every day | ORAL | Status: DC
  Administered 2013-09-17: 25 mg via ORAL
  Filled 2013-09-17: qty 1

## 2013-09-17 MED ORDER — QUETIAPINE FUMARATE 200 MG PO TABS
200.0000 mg | ORAL_TABLET | Freq: Every day | ORAL | Status: DC
  Administered 2013-09-18 – 2013-09-20 (×3): 200 mg via ORAL
  Filled 2013-09-17 (×6): qty 1

## 2013-09-17 MED ORDER — LORAZEPAM 1 MG PO TABS
1.0000 mg | ORAL_TABLET | Freq: Three times a day (TID) | ORAL | Status: DC | PRN
  Administered 2013-09-17 – 2013-09-21 (×7): 1 mg via ORAL
  Filled 2013-09-17 (×7): qty 1

## 2013-09-17 MED ORDER — METFORMIN HCL 500 MG PO TABS
500.0000 mg | ORAL_TABLET | Freq: Every day | ORAL | Status: DC
  Administered 2013-09-18 – 2013-09-23 (×6): 500 mg via ORAL
  Filled 2013-09-17 (×2): qty 1
  Filled 2013-09-17: qty 14
  Filled 2013-09-17 (×8): qty 1

## 2013-09-17 MED ORDER — LORAZEPAM 1 MG PO TABS
0.0000 mg | ORAL_TABLET | Freq: Two times a day (BID) | ORAL | Status: AC
  Administered 2013-09-19 – 2013-09-20 (×2): 1 mg via ORAL
  Administered 2013-09-20 – 2013-09-21 (×2): 2 mg via ORAL
  Filled 2013-09-17: qty 2
  Filled 2013-09-17: qty 1
  Filled 2013-09-17: qty 2
  Filled 2013-09-17: qty 1

## 2013-09-17 MED ORDER — HYDROXYZINE HCL 25 MG PO TABS
25.0000 mg | ORAL_TABLET | Freq: Four times a day (QID) | ORAL | Status: DC | PRN
  Administered 2013-09-17 – 2013-09-20 (×3): 25 mg via ORAL
  Filled 2013-09-17 (×4): qty 1

## 2013-09-17 NOTE — Progress Notes (Signed)
Patient ID: Sheryl Landry, female   DOB: Jan 09, 1961, 53 y.o.   MRN: [id number] Pt alert, oriented, irritable and anxious. Denies SI/HI, -A/V hall. Verbally contracts for safety. C/o bilateral arm aching "this happens when I'm detoxing" and wanting to sleep. See MAR. Will continue to monitor closely and evaluate for stabilization.

## 2013-09-17 NOTE — Consult Note (Signed)
  Patient states that she was brought to the hospital by her husband related to her drinking and him wanting her to get some help.  Patient states that she drinks daily.  Patient also states "I get a little depressed and have some anxiety at times."  Patient denies a history or psychiatric treatment and rehab/detox in past.  Patient denies suicidal/homicidal ideation, psychosis, and paranoia.    Agree with TTS assessment and recommend 24 hour observation.  Patient has been accepted to [facility] Observation Unit Bed 2.  Will continue to monitor for safety and stabilization until transferred.    Sheryl Landry B. Daeshon Grammatico FNP-BC

## 2013-09-17 NOTE — Consult Note (Signed)
Face to face evaluation and I agree with this note 

## 2013-09-17 NOTE — Progress Notes (Signed)
Pt awake, irritable, agitated and demanding. C/o w/d symptoms, insomnia, mild tremors and anxiety. Medication given. See MAR Will continue to monitor closely and evaluate for stabilization.

## 2013-09-17 NOTE — Progress Notes (Signed)
Pt received a list of community mental health resources from signer. Signer explained purpose of the resource to pt.

## 2013-09-17 NOTE — Progress Notes (Signed)
53 year old female admitted for ETOH detox. Patient was IVC'd by family and was transferred from [facility] to OBS unit. Patient arrived sedated and quickly fell asleep before all assessment questions could be answered. Patient was guarded about ETOH use and declined to answer ETOH assessment questions. Patient oriented to OBS Unit and provided nourishment.

## 2013-09-17 NOTE — ED Provider Notes (Signed)
CSN: [id number]     Arrival date & time 09/16/13  1739 History   First MD Initiated Contact with Patient 09/16/13 1751     Chief Complaint  Patient presents with  . Medical Clearance     (Consider location/radiation/quality/duration/timing/severity/associated sxs/prior Treatment) HPI  53 year old female brought in by police with IVC paperwork. Filled out by family. Patient heavily intoxicated. Apparently making threats to shoot herself in the head. Patient is extremely loud and agitated. Alcoholic rage. Inappropriate comments. Unable to get much useful history from her. Attempts to speak with her only further escalate her behavior.   Past Medical History  Diagnosis Date  . Diabetes mellitus without complication   . Cancer     breast   Past Surgical History  Procedure Laterality Date  . Gastric bypass     No family history on file. History  Substance Use Topics  . Smoking status: Current Every Day Smoker  . Smokeless tobacco: Not on file  . Alcohol Use: Yes   OB History   Grav Para Term Preterm Abortions TAB SAB Ect Mult Living                 Review of Systems  Level 5 caveat because pt is intoxicated.    Allergies  Review of patient's allergies indicates no known allergies.  Home Medications   Prior to Admission medications   Medication Sig Start Date End Date Taking? Authorizing Provider  amitriptyline (ELAVIL) 25 MG tablet Take 25 mg by mouth daily. 08/20/13  Yes Historical Provider, MD  metFORMIN (GLUCOPHAGE) 500 MG tablet Take 500 mg by mouth daily with breakfast.   Yes Historical Provider, MD  QUEtiapine (SEROQUEL) 200 MG tablet Take 200 mg by mouth daily. 08/20/13  Yes Historical Provider, MD  venlafaxine XR (EFFEXOR-XR) 150 MG 24 hr capsule Take 150 mg by mouth 2 (two) times daily. 07/10/13  Yes Historical Provider, MD   BP 115/75  Pulse 82  Temp(Src) 98.2 F (36.8 C) (Oral)  Resp 18  SpO2 100% Physical Exam  Nursing note and vitals  reviewed. Constitutional: She appears well-developed and well-nourished. No distress.  HENT:  Head: Normocephalic and atraumatic.  Eyes: Conjunctivae are normal. Right eye exhibits no discharge. Left eye exhibits no discharge.  Neck: Neck supple.  Cardiovascular: Normal rate, regular rhythm and normal heart sounds.  Exam reveals no gallop and no friction rub.   No murmur heard. Pulmonary/Chest: Effort normal and breath sounds normal. No respiratory distress.  Abdominal: Soft. She exhibits no distension. There is no tenderness.  Musculoskeletal: She exhibits no edema and no tenderness.  Neurological: She is alert.  Skin: Skin is warm and dry.  Psychiatric:  The patient sitting in a chair in handcuffs. Loud and confrontational. Pretty much all comments inappropriate.     ED Course  Procedures (including critical care time) Labs Review Labs Reviewed  CBC WITH DIFFERENTIAL - Abnormal; Notable for the following:    Neutrophils Relative % 24 (*)    Neutro Abs 1.3 (*)    Lymphocytes Relative 70 (*)    All other components within normal limits  BASIC METABOLIC PANEL - Abnormal; Notable for the following:    Glucose, Bld 107 (*)    GFR calc non Af Amer 76 (*)    GFR calc Af Amer 88 (*)    Anion gap 19 (*)    All other components within normal limits  URINALYSIS, ROUTINE W REFLEX MICROSCOPIC - Abnormal; Notable for the following:    Color,  Urine AMBER (*)    All other components within normal limits  ETHANOL - Abnormal; Notable for the following:    Alcohol, Ethyl (B) 265 (*)    All other components within normal limits  URINE RAPID DRUG SCREEN (HOSP PERFORMED)    Imaging Review No results found.   EKG Interpretation   Date/Time:  Monday September 16 2013 18:40:34 EDT Ventricular Rate:  87 PR Interval:  173 QRS Duration: 92 QT Interval:  385 QTC Calculation: 463 R Axis:   41 Text Interpretation:  Sinus rhythm Abnrm T, consider ischemia,  anterolateral lds ED PHYSICIAN  INTERPRETATION AVAILABLE IN [facility]  Confirmed by TEST, Record ([id number]) on 09/18/2013 7:19:10 AM      MDM   Final diagnoses:  Alcohol abuse    53 year old female with alcohol intoxication. IVC paperwork submitted because apparently patient went to shoot herself in the head. At this time she is highly intoxicated and unreliable. Will continue to hold until clinically sober and reassess.    Virgel Manifold, MD 09/18/13 1351

## 2013-09-17 NOTE — Progress Notes (Signed)
Patient irritable and anxious;  demanding "something for my nerves." Patient stating that she doesn't like being "trapped in the room with all these people." Vistaril administered PRN. Vitals stable and CIWA rated as 7.

## 2013-09-17 NOTE — BH Assessment (Signed)
Per Dr.Taylor and Shuvon Rankin,NP observation unit admission is recommended. Pt has been assigned to observation bed 2. Bed assignment has been confirmed with [facility] Marja Kays. Pt signed support paperwork and pt's ED nurse Otho Perl has been notified of pt's acceptance to obs bed.   Shaune Pollack, MS, [place]  Assessment Counselor

## 2013-09-18 DIAGNOSIS — Z853 Personal history of malignant neoplasm of breast: Secondary | ICD-10-CM | POA: Diagnosis not present

## 2013-09-18 DIAGNOSIS — Z9884 Bariatric surgery status: Secondary | ICD-10-CM | POA: Diagnosis not present

## 2013-09-18 DIAGNOSIS — E119 Type 2 diabetes mellitus without complications: Secondary | ICD-10-CM | POA: Diagnosis present

## 2013-09-18 DIAGNOSIS — Z833 Family history of diabetes mellitus: Secondary | ICD-10-CM | POA: Diagnosis not present

## 2013-09-18 DIAGNOSIS — G4700 Insomnia, unspecified: Secondary | ICD-10-CM | POA: Diagnosis present

## 2013-09-18 DIAGNOSIS — Z9010 Acquired absence of unspecified breast and nipple: Secondary | ICD-10-CM | POA: Diagnosis not present

## 2013-09-18 DIAGNOSIS — Z598 Other problems related to housing and economic circumstances: Secondary | ICD-10-CM | POA: Diagnosis not present

## 2013-09-18 DIAGNOSIS — R45850 Homicidal ideations: Secondary | ICD-10-CM | POA: Diagnosis not present

## 2013-09-18 DIAGNOSIS — F1994 Other psychoactive substance use, unspecified with psychoactive substance-induced mood disorder: Secondary | ICD-10-CM | POA: Diagnosis present

## 2013-09-18 DIAGNOSIS — F332 Major depressive disorder, recurrent severe without psychotic features: Secondary | ICD-10-CM | POA: Diagnosis present

## 2013-09-18 DIAGNOSIS — F17200 Nicotine dependence, unspecified, uncomplicated: Secondary | ICD-10-CM | POA: Diagnosis present

## 2013-09-18 DIAGNOSIS — R45851 Suicidal ideations: Secondary | ICD-10-CM | POA: Diagnosis not present

## 2013-09-18 DIAGNOSIS — F410 Panic disorder [episodic paroxysmal anxiety] without agoraphobia: Secondary | ICD-10-CM | POA: Diagnosis present

## 2013-09-18 DIAGNOSIS — Z5987 Material hardship: Secondary | ICD-10-CM | POA: Diagnosis not present

## 2013-09-18 DIAGNOSIS — Z5989 Other problems related to housing and economic circumstances: Secondary | ICD-10-CM | POA: Diagnosis not present

## 2013-09-18 DIAGNOSIS — F1020 Alcohol dependence, uncomplicated: Secondary | ICD-10-CM | POA: Diagnosis present

## 2013-09-18 DIAGNOSIS — F411 Generalized anxiety disorder: Secondary | ICD-10-CM | POA: Diagnosis present

## 2013-09-18 NOTE — BHH Group Notes (Signed)
Adult Psychoeducational Group Note  Date:  09/18/2013 Time:  9:40 PM  Group Topic/Focus:  Wrap-Up Group:   The focus of this group is to help patients review their daily goal of treatment and discuss progress on daily workbooks.  Participation Level:  Did Not Attend  Participation Quality:  None  Affect:  None  Cognitive:  None  Insight: None  Engagement in Group:  None  Modes of Intervention:  Discussion  Additional Comments:  Sheryl Landry did not attend group.  Victorino Sparrow A 09/18/2013, 9:40 PM

## 2013-09-18 NOTE — Progress Notes (Signed)
Patient admitted from the observation; patient denies SI and admitted being HI but did not named anyone specific; patient stated " ya'll trying to trip me up; patient reported that she hears voices but does not understand what they are saying and she denies visual hallucinations

## 2013-09-18 NOTE — Progress Notes (Signed)
Patient ID: Sheryl Landry, female   DOB: 1960/10/11, 53 y.o.   MRN: [id number] D-Initially demanding and abrupt but has been more pleasant after the initial start to the day. She complained of "swimmy head" and was unable to change her linen. Assisted Probation officer with the task. Asked for an Ambien with am meds, she doesn't have that ordered, additionally cant give her a sleeping pill in am. She complained of poor sleep for two days and anxiety. Gave her prn Ativan.She showered and went to sleep after showering. A-Support offered. Medications as ordered. Continue to monitor for safety. R-No complaints voiced since taking prn. Sleeping.

## 2013-09-18 NOTE — Progress Notes (Addendum)
Report from nurse, Collie Siad. Spoke with pts husband, Marden Noble and was reassured by the husband that he is going to keep his gun at his parents house. Gershon Mussel is arranging for outpt drug and alcohol treatment.Pt told Gershon Mussel, That she continues to have Si and HI especially when she starts to feel anxious. She states the only way to get out of that situation would be to kill the people around her or herself.Pt is eating lunch.pt admits she is hearing voices of boys and girls laughing and talking to her. Tom made aware. Pt continues to appear very groggy.

## 2013-09-18 NOTE — Plan of Care (Signed)
[facility] Observation Crisis Plan  Reason for Crisis Plan:  Crisis Stabilization   Plan of Care:  Referral for Inpatient Hospitalization  Family Support:    Husband  Current Living Environment:  Living Arrangements: Spouse/significant other; can return  Insurance:  [facility] Account   Name Acct ID Class Status Primary Coverage   Sheryl Landry, Sheryl Landry [id number] [employer]        Guarantor Account (for Hospital Account [id number])   Name Relation to Pt Service Area Active? Acct Type   Sheryl Landry Self [facility] Yes Behavioral Health   Address Phone       [address], [place] [address] [phone])          Coverage Information (for Hospital Account [id number])   F/O Payor/Plan Precert #   [facility]    Subscriber Subscriber #   Sheryl Landry, Sheryl Landry [id number]   Address Phone   [address] [place] [address] [phone]      Legal Guardian:   Self  Primary Care Provider:  No primary provider on file.; Lawerance Cruel, MD with [facility]  Current Outpatient Providers:  None  Psychiatrist:   None  Counselor/Therapist:   None  Compliant with Medications:  No; usually compliant, but sometimes unable to afford them.  Additional Information: After consulting with Catalina Pizza, NP it has been determined that pt presents a life threatening danger to herself and others for which psychiatric hospitalization is indicated.  Pt accepted to [facility], Rm 407-1.  Pt signed Voluntary Admission and Consent for Treatment.  She also signed Consent to Release Information to her PCP, Dr Harrington Challenger.  Jalene Mullet, [place] Triage Specialist Abbe Amsterdam 8/19/20151:10 PM

## 2013-09-18 NOTE — H&P (Signed)
[place] OBS UNIT H&P   Subjective: Pt seen and chart reviewed. Pt denies HI and AVH but affirms some SI with specific plan. Pt reports that she would possibly like to do residential detox.    HPI:  Sheryl Landry is an 53 y.o. female with SI.  The patient was brought in by police with IVC paperwork by the patients family. Patient heavily intoxicated. Apparently making threats to shoot herself in the head. Patient is extremely loud and agitated. Alcoholic rage. Inappropriate comments. Documentation in epic reports that the patient was cursing at staff and sheriff. Per IVC papers, pt is abusing alcohol, pt's husband reports pt has verbalized taking a gun to her head. Pt with hx of depression.  She received Geodon IM prior to coming back to the unit.    Axis I: Alcohol Abuse, Major Depression, Recurrent severe, Substance Abuse and Substance Induced Mood Disorder Axis II: Deferred Axis III:  Past Medical History  Diagnosis Date  . Diabetes mellitus without complication   . Cancer     breast   Axis IV: economic problems, housing problems, occupational problems, other psychosocial or environmental problems, problems related to social environment and problems with primary support group Axis V: 41-50 serious symptoms   Psychiatric Specialty Exam: Physical Exam  ROS  Blood pressure 133/94, pulse 97, temperature 98.5 F (36.9 C), temperature source Oral, resp. rate 16, height 5\' 3"  (1.6 m), weight 61.236 kg (135 lb), SpO2 98.00%.Body mass index is 23.92 kg/(m^2).  General Appearance: Disheveled  Eye Sport and exercise psychologist::  Fair  Speech:  Clear and Coherent  Volume:  Normal  Mood:  Euthymic  Affect:  Appropriate and Congruent  Thought Process:  Coherent  Orientation:  Full (Time, Place, and Person)  Thought Content:  WDL  Suicidal Thoughts:  No  Homicidal Thoughts:  No  Memory:  Immediate;   Fair Recent;   Fair Remote;   Fair  Judgement:  Fair  Insight:  Fair  Psychomotor Activity:  Normal   Concentration:  Good  Recall:  Fair  Akathisia:  No  Handed:    AIMS (if indicated):     Assets:  Communication Skills Desire for Improvement Resilience  Sleep:         Past Medical History:  Past Medical History  Diagnosis Date  . Diabetes mellitus without complication   . Cancer     breast    Past Surgical History  Procedure Laterality Date  . Gastric bypass      Family History: History reviewed. No pertinent family history.  Social History:  reports that she has been smoking Cigarettes.  She has been smoking about 0.00 packs per day. She has never used smokeless tobacco. She reports that she drinks alcohol. She reports that she does not use illicit drugs.  Additional Social History:     CIWA: CIWA-Ar BP: 133/94 mmHg Pulse Rate: 97 Nausea and Vomiting: mild nausea with no vomiting Tactile Disturbances: none Tremor: not visible, but can be felt fingertip to fingertip Auditory Disturbances: not present Paroxysmal Sweats: barely perceptible sweating, palms moist Visual Disturbances: not present Anxiety: no anxiety, at ease Headache, Fullness in Head: mild Agitation: normal activity Orientation and Clouding of Sensorium: oriented and can do serial additions CIWA-Ar Total: 5 COWS: Clinical Opiate Withdrawal Scale (COWS) Resting Pulse Rate: Pulse Rate 80 or below Sweating: No report of chills or flushing Restlessness: Able to sit still Pupil Size: Pupils pinned or normal size for room light Bone or Joint Aches: Not present Runny Nose  or Tearing: Not present GI Upset: No GI symptoms Tremor: Tremor can be felt, but not observed Yawning: No yawning Anxiety or Irritability: None Gooseflesh Skin: Skin is smooth COWS Total Score: 1  Allergies: No Known Allergies  Home Medications:  Medications Prior to Admission  Medication Sig Dispense Refill  . amitriptyline (ELAVIL) 25 MG tablet Take 25 mg by mouth daily.      . metFORMIN (GLUCOPHAGE) 500 MG tablet Take  500 mg by mouth daily with breakfast.      . QUEtiapine (SEROQUEL) 200 MG tablet Take 200 mg by mouth daily.      Marland Kitchen venlafaxine XR (EFFEXOR-XR) 150 MG 24 hr capsule Take 150 mg by mouth 2 (two) times daily.        Disposition:  Observe in OBS UNIT overnight for detox purposes to determine necessity of placement vs. discharge      Benjamine Mola , FNP-BC 09/17/2013 2:25 PM I agree with assessment and plan Geralyn Flash A. Sabra Heck, M.D.

## 2013-09-19 ENCOUNTER — Encounter (HOSPITAL_COMMUNITY): Payer: Self-pay | Admitting: Psychiatry

## 2013-09-19 DIAGNOSIS — F410 Panic disorder [episodic paroxysmal anxiety] without agoraphobia: Secondary | ICD-10-CM

## 2013-09-19 DIAGNOSIS — F419 Anxiety disorder, unspecified: Secondary | ICD-10-CM

## 2013-09-19 DIAGNOSIS — F411 Generalized anxiety disorder: Secondary | ICD-10-CM

## 2013-09-19 LAB — GLUCOSE, CAPILLARY: Glucose-Capillary: 85 mg/dL (ref 70–99)

## 2013-09-19 MED ORDER — DOCUSATE SODIUM 100 MG PO CAPS
100.0000 mg | ORAL_CAPSULE | Freq: Two times a day (BID) | ORAL | Status: DC
  Administered 2013-09-19 – 2013-09-22 (×7): 100 mg via ORAL
  Filled 2013-09-19 (×2): qty 1
  Filled 2013-09-19: qty 28
  Filled 2013-09-19 (×6): qty 1
  Filled 2013-09-19: qty 28
  Filled 2013-09-19 (×3): qty 1

## 2013-09-19 MED ORDER — GABAPENTIN 100 MG PO CAPS
200.0000 mg | ORAL_CAPSULE | Freq: Two times a day (BID) | ORAL | Status: DC
  Administered 2013-09-19 – 2013-09-20 (×2): 200 mg via ORAL
  Filled 2013-09-19 (×4): qty 2

## 2013-09-19 MED ORDER — TRAZODONE HCL 100 MG PO TABS
100.0000 mg | ORAL_TABLET | Freq: Every day | ORAL | Status: DC
  Administered 2013-09-19: 100 mg via ORAL
  Filled 2013-09-19 (×2): qty 1

## 2013-09-19 NOTE — Progress Notes (Signed)
Patient ID: Sheryl Landry, female   DOB: Aug 07, 1960, 53 y.o.   MRN: [id number] D: Client in room reports "I have the shakes, I got a headache and can't sleep" When asked if she heard voices client responds "yea, I hear them talking to me" Client irritable, complains that medications are not right. I told them I take my medication at night, I take two Seroquel and Amitriptyline at night and I take my Ambien through out the day, cause sometimes I can't sleep" A: Writer reviewed medications and encouraged her speak with physician in the morning about any changes to be made. Staff will monitor q1min for safety. R: Client is safe on the unit, attended group.

## 2013-09-19 NOTE — BHH Suicide Risk Assessment (Signed)
[place] INPATIENT:  Family/Significant Other Suicide Prevention Education  Suicide Prevention Education:  Education Completed; Finley Dinkel, husband, has been identified by the patient as the family member/significant other with whom the patient will be residing, and identified as the person(s) who will aid the patient in the event of a mental health crisis (suicidal ideations/suicide attempt).  With written consent from the patient, the family member/significant other has been provided the following suicide prevention education, prior to the and/or following the discharge of the patient.  The suicide prevention education provided includes the following:  Suicide risk factors  Suicide prevention and interventions  National Suicide Hotline telephone number  [facility] assessment telephone number  [facility] Emergency Assistance [place] and/or Residential Mobile Crisis Unit telephone number  Request made of family/significant other to:  Remove weapons (e.g., guns, rifles, knives), all items previously/currently identified as safety concern.    Remove drugs/medications (over-the-counter, prescriptions, illicit drugs), all items previously/currently identified as a safety concern.  The family member/significant other verbalizes understanding of the suicide prevention education information provided.  The family member/significant other agrees to remove the items of safety concern listed above.  Bo Mcclintock 09/19/2013, 11:27 AM
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
## 2013-09-19 NOTE — Progress Notes (Signed)
Patient ID: Sheryl Landry, female   DOB: 22-Jan-1961, 53 y.o.   MRN: [id number]  D: Pt informed the writer that "she wished she'd stayed where she was."  Asked the writer about not having a clock on the wall, and became more irritated when discussing the fact that tv's were not in the rooms. Pt stated, "well I'm not gonna do anything anyway. I'm just gonna stay in my room". Writer encouraged pt to attend group.  A:  Support and encouragement was offered. 15 min checks continued for safety.  R: Pt remains safe.

## 2013-09-19 NOTE — H&P (Signed)
Psychiatric Admission Assessment Adult  Patient Identification:  Sheryl Landry Date of Evaluation:  09/19/2013 Chief Complaint:  " I am anxious and have been drinking a lot". History of Present Illness:: Patient is a 53 year old aaf, married ,unemployed lives with husband ,was IVC ed for severe alcohol intoxication. Patient presented to [facility] on 8/17 and was thereafter transferred to OBS ,where she appeared to be very irritable,aggressive ,demanding . Patient currently detoxified .  Patient reports a history of alcohol abuse since the past 1 year. Reports that she initially started using liquor and then started drinking wine to the point that she would drink 5-6 bottles a day. She reports losing her job a year ago since she did not go to work secondary to not having a good mood. She also reports a hx of breat cancer as well as left side mastectomy several years ago. She reports being started on antidepressant after that and was tried on several meds including prozac, currently on effexor 225mg . She reports that effexor works best for her and that if she does not take it she could become very anxious and have paranoia. She currently denies any SI/HI. Reports AH of hearing her name called in the past  ,but does not have it at present .   Elements:  Location:  alcohol abuse, severe,anxiety,insomnia. Quality:  alcohol abuse since she lost job a year ago, anxiety sx after her mastectomy ,s/p breatst ca.. Severity:  severe. Timing:  1 year, worsening since the past 1 week.. Duration:  1 year. Context:  joblessness,health problems. Associated Signs/Synptoms: Depression Symptoms:  depressed mood, insomnia, fatigue, hopelessness, anxiety, (Hypo) Manic Symptoms:  None Anxiety Symptoms:  Excessive Worry, Panic Symptoms, Psychotic Symptoms:  Paranoia, PTSD Symptoms: Negative Total Time spent with patient: 1 hour  Psychiatric Specialty Exam: Physical Exam  Constitutional: She is oriented to  person, place, and time. She appears well-developed and well-nourished.  HENT:  Head: Normocephalic.  Eyes: Pupils are equal, round, and reactive to light.  Neck: Normal range of motion. Neck supple.  Cardiovascular: Normal rate and regular rhythm.   Respiratory: Effort normal.  GI: Soft.  Musculoskeletal: Normal range of motion.  Neurological: She is alert and oriented to person, place, and time.  Skin: Skin is warm.  Psychiatric: Her speech is normal. Thought content normal. Her mood appears anxious. She is aggressive. Cognition and memory are normal. She expresses impulsivity. She exhibits a depressed mood.    Review of Systems  Constitutional: Negative.   HENT: Negative.   Eyes: Negative.   Respiratory: Negative.   Cardiovascular: Negative.   Gastrointestinal: Negative.   Musculoskeletal: Negative.   Skin: Negative.   Psychiatric/Behavioral: Positive for depression, suicidal ideas, hallucinations and substance abuse. The patient is nervous/anxious and has insomnia.     Blood pressure 94/69, pulse 93, temperature 98.1 F (36.7 C), temperature source Oral, resp. rate 20, height 5\' 3"  (1.6 m), weight 61.236 kg (135 lb), SpO2 98.00%.Body mass index is 23.92 kg/(m^2).  General Appearance: Casual  Eye Contact::  Fair  Speech:  Normal Rate  Volume:  Normal  Mood:  Anxious and Depressed  Affect:  Labile  Thought Process:  Linear  Orientation:  Full (Time, Place, and Person)  Thought Content:  Rumination  Suicidal Thoughts:  No  Homicidal Thoughts:  No  Memory:  Immediate;   Fair Recent;   Fair Remote;   Fair  Judgement:  Poor  Insight:  Lacking  Psychomotor Activity:  Normal  Concentration:  Poor  Recall:  Poor  Fund of Knowledge:Poor  Language: Poor  Akathisia:  No    AIMS (if indicated):   0  Assets:  Communication Skills Desire for Improvement Social Support  Sleep:  Number of Hours: 4    Musculoskeletal: Strength & Muscle Tone: within normal limits Gait &  Station: normal Patient leans: N/A  Past Psychiatric History: Diagnosis:Yes ,anxiety do,MDD   Hospitalizations:Denies  Outpatient Care:Yes ,on Effexor 300 mg ,elavil,seroquel (since 1990)   Substance Abuse Care:denies  Self-Mutilation:denies  Suicidal Attempts:denies  Violent Behaviors:denies   Past Medical History:   Past Medical History  Diagnosis Date  . Diabetes mellitus without complication   . Cancer     breast   None. Allergies:  No Known Allergies PTA Medications: Prescriptions prior to admission  Medication Sig Dispense Refill  . amitriptyline (ELAVIL) 25 MG tablet Take 25 mg by mouth daily.      . metFORMIN (GLUCOPHAGE) 500 MG tablet Take 500 mg by mouth daily with breakfast.      . QUEtiapine (SEROQUEL) 200 MG tablet Take 200 mg by mouth daily.      Marland Kitchen venlafaxine XR (EFFEXOR-XR) 150 MG 24 hr capsule Take 150 mg by mouth 2 (two) times daily.        Previous Psychotropic Medications:  Medication/Dose  Elavil 25 mg  effexor 300 mg   seroquel 200 mg           Substance Abuse History in the last 12 months:  Yes.    Consequences of Substance Abuse: Legal Consequences:  DWI Family Consequences:  relational struggles Withdrawal Symptoms:   currently improvinf with mild anxiety and sleep issues  Social History:  reports that she has been smoking Cigarettes.  She has been smoking about 0.00 packs per day. She has never used smokeless tobacco. She reports that she drinks alcohol. She reports that she does not use illicit drugs. 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 Legal  History:DWI Hobbies/Interests:WATCH TV  Family History:   Family History  Problem Relation Age of Onset  . Diabetes type II Mother     Results for orders placed during the hospital encounter of 09/17/13 (from the past 72 hour(s))  GLUCOSE, CAPILLARY     Status: None   Collection Time    09/19/13  6:32 AM      Result Value Ref Range   Glucose-Capillary 85  70 - 99 mg/dL   Psychological Evaluations:  Assessment:   DSM5:  Primary psychiatric diagnosis: Generalized anxiety disorder  Secondary psychiatric diagnosis: Alcohol use disorder  Alcohol withdrawal Tobacco use disorder  Non psychiatric diagnosis: Diabetes Mellitus Breast ca, s/p mastectomy (left)   Past Medical History  Diagnosis Date  . Diabetes mellitus without complication   . Cancer     breast   Treatment Plan/Recommendations:   Patient will benefit from inpatient stay and stabilization. Will continue CIWA protocol, on ativan taper per CIWA. Will continue effexor at the current dose . Will discontinue elavil ,doxepin, start trazodone for sleep. Will add gabapentin 200 mg po bid for anxiety sx.       Treatment Plan Summary: Daily contact with patient to assess and evaluate  symptoms and progress in treatment Medication management Current Medications:  Current Facility-Administered Medications  Medication Dose Route Frequency Provider Last Rate Last Dose  . docusate sodium (COLACE) capsule 100 mg  100 mg Oral BID Ursula Alert, MD   100 mg at 09/19/13 1157  . hydrOXYzine (ATARAX/VISTARIL) tablet 25 mg  25 mg Oral Q6H PRN Benjamine Mola, FNP   25 mg at 09/18/13 1429  . ibuprofen (ADVIL,MOTRIN) tablet 600 mg  600 mg Oral Q6H PRN Laverle Hobby, PA-C   600 mg at 09/19/13 0255  . LORazepam (ATIVAN) injection 0-4 mg  0-4 mg Intravenous 4 times per day Shuvon Rankin, NP       Followed by  . LORazepam (ATIVAN) injection 0-4 mg  0-4 mg Intravenous Q12H Shuvon Rankin, NP      . LORazepam (ATIVAN) tablet  0-4 mg  0-4 mg Oral 4 times per day Shuvon Rankin, NP   1 mg at 09/18/13 1712   Followed by  . LORazepam (ATIVAN) tablet 0-4 mg  0-4 mg Oral Q12H Shuvon Rankin, NP      . LORazepam (ATIVAN) tablet 1 mg  1 mg Oral Q8H PRN Shuvon Rankin, NP   1 mg at 09/19/13 0255  . magnesium hydroxide (MILK OF MAGNESIA) suspension 30 mL  30 mL Oral Daily PRN Shuvon Rankin, NP      . metFORMIN (GLUCOPHAGE) tablet 500 mg  500 mg Oral Q breakfast Shuvon Rankin, NP   500 mg at 09/19/13 1610  . nicotine (NICODERM CQ - dosed in mg/24 hours) patch 21 mg  21 mg Transdermal Daily Shuvon Rankin, NP      . ondansetron (ZOFRAN) tablet 4 mg  4 mg Oral Q8H PRN Shuvon Rankin, NP   4 mg at 09/19/13 0256  . QUEtiapine (SEROQUEL) tablet 200 mg  200 mg Oral Daily Shuvon Rankin, NP   200 mg at 09/19/13 0831  . thiamine (VITAMIN B-1) tablet 100 mg  100 mg Oral Daily Shuvon Rankin, NP   100 mg at 09/19/13 9604   Or  . thiamine (B-1) injection 100 mg  100 mg Intravenous Daily Shuvon Rankin, NP      . traZODone (DESYREL) tablet 100 mg  100 mg Oral QHS Lachrisha Ziebarth, MD      . venlafaxine XR (EFFEXOR-XR) 24 hr capsule 150 mg  150 mg Oral BID Shuvon Rankin, NP   150 mg at 09/19/13 5409    Observation Level/Precautions:  15 minute checks  Laboratory:  reviewed.               I certify that inpatient services furnished can reasonably be expected to improve the patient's condition.   Cyndra Feinberg 8/20/20151:42 PM

## 2013-09-19 NOTE — BHH Suicide Risk Assessment (Signed)
   Nursing information obtained from:   Patient Demographic factors:   Patient is a 53 year old AAF,who is unemployed,married lives with her husband. Current Mental Status:   Patient presents as anxious,mood lability,irritability,has sleeplessness,reported SI/HI this AM. Loss Factors:   Loss of job Historical Factors:   History of alcohol abuse Risk Reduction Factors:   Good social support from family,desire  To improve. Total Time spent with patient: 20 minutes  CLINICAL FACTORS:   Severe Anxiety and/or Agitation Panic Attacks Alcohol/Substance Abuse/Dependencies  Psychiatric Specialty Exam: Physical Exam  Constitutional: She is oriented to person, place, and time. She appears well-developed and well-nourished.  HENT:  Head: Normocephalic.  Eyes: Conjunctivae are normal. Pupils are equal, round, and reactive to light.  Neck: Normal range of motion. Neck supple.  Cardiovascular: Normal rate and regular rhythm.   Respiratory: Effort normal and breath sounds normal.  GI: Soft.  Musculoskeletal: Normal range of motion.  Neurological: She is alert and oriented to person, place, and time. No cranial nerve deficit. She exhibits normal muscle tone. Coordination normal.  Skin: Skin is warm and dry.  Psychiatric:  Anxious,has panic attacks,insomnia    Review of Systems  Constitutional: Negative.   HENT: Negative.   Eyes: Negative.   Respiratory: Negative.   Cardiovascular: Negative.   Musculoskeletal: Negative.   Skin: Negative.   Psychiatric/Behavioral: Positive for suicidal ideas and substance abuse. The patient is nervous/anxious and has insomnia.        Also has HI on presentation    Blood pressure 110/84, pulse 93, temperature 98.1 F (36.7 C), temperature source Oral, resp. rate 20, height 5\' 3"  (1.6 m), weight 61.236 kg (135 lb), SpO2 98.00%.Body mass index is 23.92 kg/(m^2).  General Appearance: Disheveled  Eye Sport and exercise psychologist::  Fair  Speech:  Normal Rate  Volume:  Decreased   Mood:  Dysphoric  Affect:  Labile  Thought Process:  Linear  Orientation:  Full (Time, Place, and Person)  Thought Content:  denies AH/VH but reports she has paranoia and voices on and off when she is not on her medications  Suicidal Thoughts:  No  Homicidal Thoughts:  No  Memory:  Immediate;   Fair Recent;   Fair Remote;   Fair  Judgement:  Impaired  Insight:  Lacking  Psychomotor Activity:  Normal  Concentration:  Poor  Recall:  Poor  Fund of Knowledge:Good  Language: Good  Akathisia:  No    AIMS (if indicated):     Assets:  Communication Skills Desire for Improvement Housing Intimacy Social Support  Sleep:  Number of Hours: 4   Musculoskeletal: Strength & Muscle Tone: within normal limits Gait & Station: normal Patient leans: N/A  COGNITIVE FEATURES THAT CONTRIBUTE TO RISK:  Closed-mindedness Polarized thinking Thought constriction (tunnel vision)    SUICIDE RISK:   Mild:  Suicidal ideation of limited frequency, intensity, duration, and specificity.  There are no identifiable plans, no associated intent, mild dysphoria and related symptoms, good self-control (both objective and subjective assessment), few other risk factors, and identifiable protective factors, including available and accessible social support.  PLAN OF CARE:  I certify that inpatient services furnished can reasonably be expected to improve the patient's condition.  [place] 09/19/2013, 11:13 AM

## 2013-09-19 NOTE — Tx Team (Signed)
  Date: 09/19/2013 9:37 AM  Progress in Treatment:  Attending groups: No Participating in groups: No Taking medication as prescribed: Yes  Tolerating medication: Yes  Family/Significant othe contact made: No, CSW to assess for potential collateral contacts  Patient understands diagnosis: Yes Discussing patient identified problems/goals with staff: Yes  Medical problems stabilized or resolved: Yes  Denies suicidal/homicidal ideation: No Patient has not harmed self or Others: Yes   New problem(s) identified: none at this time  Discharge Plan or Barriers: Unknown, CSW to assess for potential after-care plan  Additional comments: Sheryl Landry is an 53 y.o. female with SI. The patient was brought in by police with IVC paperwork by the patients family. Patient heavily intoxicated. Apparently making threats to shoot herself in the head. Patient is extremely loud and agitated. Alcoholic rage. Inappropriate comments. Documentation in epic reports that the patient was cursing at staff and sheriff. Per IVC papers, pt is abusing alcohol, pt's husband reports pt has verbalized taking a gun to her head. Pt with hx of depression. She received Geodon IM prior to coming back to the unit.    Reason for Continuation of Hospitalization:  Anxiety Depression Medication stabilization Suicidal ideation/Homocidal ideation  Withdrawal symptoms Psychotic  Estimated length of stay: 3-5 days  For review of initial/current patient goals, please see plan of care.   Attendees:  Attendees:  Patient:    Family:    Physician: Dr. Darleene Cleaver, MD; Dr. Shea Evans, MD  09/19/2013 9:37 AM  Nursing: Marcello Moores, RN  09/19/2013 9:37 AM  Clinical Social Worker Rod Denhoff, [place]  09/19/2013 9:37 AM  Other: Bertram Denver, Social Work Intern  09/19/2013 9:37 AM  Other:  RN 09/19/2013 9:37 AM  Other: , RN Charge Nurse 09/19/2013 9:37 AM  Other:     Peri Maris, Latanya Presser MSW

## 2013-09-19 NOTE — BHH Group Notes (Signed)
[facility] LCSW Group Therapy  09/19/2013 3:36 PM   Type of Therapy:  Group Therapy  Participation Level: Did not attend  Summary of Progress/Problems: Today's group focused on relapse prevention.  We defined the term, and then brainstormed on ways to prevent relapse.    Roque Lias B 09/19/2013 , 3:36 PM

## 2013-09-19 NOTE — Progress Notes (Signed)
Patient ID: Sheryl Landry, female   DOB: 13-May-1960, 53 y.o.   MRN: [id number]  D: Pt. Denies HI this morning. Patient reports SI but can contract for safety. Patient reports A/V hallucinations today but refuses to elaborate. Patient reports generalized pain but refuses Ibuprofen. Patient is irritable during interactions with staff. Patient asked, "why do I have to come get my medications here, why can't you bring them to me?" Writer informed patient that [facility] is different in that patient's are encouraged and expected to attend groups for treatment and that patient is to get out of bed for medications because she is able to walk. Patient was not satisfied with this answer. Patient rates her depression and hopelessness at 5/10 for the day. Patient rates her anxiety at 10/10 for the day.   A: Support and encouragement provided to the patient to attend groups and to participate in treatment. Patient is not attending groups at this time. Patient is seen in the milieu at times. Scheduled medications are administered to patient per physician's orders.  R: Patient continues to be irritable and forwards very little. Patient is unwilling to participate at this time. Q15 minute checks are maintained for safety.

## 2013-09-19 NOTE — BHH Group Notes (Signed)
[facility] Group Notes:  (Nursing/MHT/Case Management/Adjunct)  Date:  09/19/2013  Time:  11:28 AM  Type of Therapy:  Nurse Education  Participation Level:  Did Not Attend  Participation Quality:  did not attend  Affect:  did not attend  Cognitive:  did not attend  Insight:  None  Engagement in Group:  None  Modes of Intervention:  Discussion  Summary of Progress/Problems: Did not attend  Margaretmary Bayley, Davin Muramoto E 09/19/2013, 11:28 AM

## 2013-09-19 NOTE — Progress Notes (Signed)
D Pt. Denies SI and HI, does complain of some back pain  Which the adm. neurontin will hopefully relieve.  Pt. Is angry and agitated about everything.  A Writer offered support and encouragement,   T Pt. Remains safe on the unit,  Pt. Denies A and VH but reports that she has been dreaming about a loved one who has passed, when she reaches out for them they disappear. Pt. Reports the dream did upset her because it is the first time she has had this dream.  Pt. did receive ativan which she reports relieved the anxiety.

## 2013-09-20 LAB — GLUCOSE, CAPILLARY: Glucose-Capillary: 84 mg/dL (ref 70–99)

## 2013-09-20 MED ORDER — TRAZODONE HCL 100 MG PO TABS
100.0000 mg | ORAL_TABLET | Freq: Every evening | ORAL | Status: DC | PRN
  Administered 2013-09-20 – 2013-09-22 (×3): 100 mg via ORAL
  Filled 2013-09-20: qty 1
  Filled 2013-09-20: qty 14
  Filled 2013-09-20 (×2): qty 1

## 2013-09-20 MED ORDER — QUETIAPINE FUMARATE 200 MG PO TABS
200.0000 mg | ORAL_TABLET | Freq: Every day | ORAL | Status: DC
  Filled 2013-09-20 (×2): qty 1

## 2013-09-20 MED ORDER — GABAPENTIN 300 MG PO CAPS
300.0000 mg | ORAL_CAPSULE | Freq: Three times a day (TID) | ORAL | Status: DC
  Administered 2013-09-20 – 2013-09-23 (×10): 300 mg via ORAL
  Filled 2013-09-20 (×4): qty 1
  Filled 2013-09-20 (×2): qty 42
  Filled 2013-09-20 (×3): qty 1
  Filled 2013-09-20: qty 42
  Filled 2013-09-20 (×5): qty 1

## 2013-09-20 MED ORDER — VENLAFAXINE HCL ER 150 MG PO CP24
300.0000 mg | ORAL_CAPSULE | Freq: Every morning | ORAL | Status: DC
  Administered 2013-09-21 – 2013-09-23 (×3): 300 mg via ORAL
  Filled 2013-09-20 (×4): qty 2
  Filled 2013-09-20: qty 28

## 2013-09-20 MED ORDER — VENLAFAXINE HCL ER 150 MG PO CP24
150.0000 mg | ORAL_CAPSULE | Freq: Once | ORAL | Status: AC
  Administered 2013-09-20: 150 mg via ORAL
  Filled 2013-09-20: qty 1

## 2013-09-20 NOTE — BHH Group Notes (Signed)
[facility] LCSW Aftercare Discharge Planning Group Note  09/20/2013 8:45 AM  Participation Quality: Engaged   Mood/Affect: Flat   Depression Rating: unknown    Anxiety Rating: unknown   Thoughts of Suicide: No   Will you contract for safety? NA   Current AVH: Not currently, but heard them during the night   Plan for Discharge/Comments: Pt reports being hopeful today that she can "stay on the right track" if she "stands on the word" and remains positive.  Pt reports that she plans to go to [facility] meetings when she is discharged.  Pt has appointments at [facility] care for outpatient care.    Transportation Means: family   Supports: husband   Peri Maris, Latanya Presser 09/20/2013 11:09 AM

## 2013-09-20 NOTE — Progress Notes (Signed)
[facility] MD Progress Note  09/20/2013 11:05 AM Sheryl Landry  MRN:  [id number] Subjective:  " I an dizzy "   Objective:Patient seen and chart reviewed. Patient reports that she does not feel good. She reports anxiety,passive SI, as well as sleeplessness. Per staff patient also reported nausea as well as anxiety and irritability and needed prn ativan. Patient requests ambien bid for her sleep issues and reports that she also has periods when she feels some one is talking to her and she notices that it is not true when she turns around.    Diagnosis:   DSM5:  Primary psychiatric diagnosis:  Generalized anxiety disorder   Secondary psychiatric diagnosis:  Alcohol use disorder  Alcohol withdrawal  Tobacco use disorder   Non psychiatric diagnosis:  Diabetes Mellitus  Breast ca, s/p mastectomy (left      ADL's:  Intact  Sleep: Fair  Appetite:  Good  Suicidal Ideation:  Denies Homicidal Ideation:  Denies  AEB (as evidenced by):  Psychiatric Specialty Exam: Physical Exam  Constitutional: She is oriented to person, place, and time. She appears well-developed and well-nourished.  HENT:  Head: Normocephalic and atraumatic.  Eyes: Conjunctivae are normal. Pupils are equal, round, and reactive to light.  Neck: Normal range of motion.  Cardiovascular: Normal rate and regular rhythm.   Respiratory: Effort normal.  GI: Soft.  Musculoskeletal: Normal range of motion.  Neurological: She is alert and oriented to person, place, and time.  Skin: Skin is warm.  Psychiatric: Her speech is normal and behavior is normal. Her mood appears anxious. Cognition and memory are normal. She expresses impulsivity. She exhibits a depressed mood. She expresses suicidal ideation.    Review of Systems  Constitutional: Positive for malaise/fatigue and diaphoresis.  Eyes: Negative.   Respiratory: Negative.   Cardiovascular: Negative.   Gastrointestinal: Negative.   Musculoskeletal: Positive for  myalgias.  Neurological: Positive for dizziness and headaches.  Psychiatric/Behavioral: Positive for suicidal ideas and hallucinations. The patient is nervous/anxious and has insomnia.     Blood pressure 97/72, pulse 97, temperature 98.1 F (36.7 C), temperature source Oral, resp. rate 16, height 5\' 3"  (1.6 m), weight 61.236 kg (135 lb), SpO2 98.00%.Body mass index is 23.92 kg/(m^2).  General Appearance: Disheveled  Eye Contact::  Good  Speech:  Normal Rate  Volume:  Normal  Mood:  Anxious, Depressed and Irritable  Affect:  Labile  Thought Process:  Goal Directed  Orientation:  Full (Time, Place, and Person)  Thought Content:  Hallucinations: hearing some one talk to her when she is about to go to bed ,  Suicidal Thoughts:  Yes.  without intent/plan-PASSIVE  Homicidal Thoughts:  No  Memory:  Immediate;   Fair Recent;   Fair Remote;   Fair  Judgement:  Impaired  Insight:  Lacking  Psychomotor Activity:  Normal  Concentration:  Poor  Recall:  [employer] of Knowledge:Fair  Language: Good  Akathisia:  No    AIMS (if indicated):   0  Assets:  Communication Skills Desire for Improvement Financial Resources/Insurance  Sleep:  Number of Hours: 6.25   Musculoskeletal: Strength & Muscle Tone: within normal limits Gait & Station: normal Patient leans: N/A  Current Medications: Current Facility-Administered Medications  Medication Dose Route Frequency Provider Last Rate Last Dose  . docusate sodium (COLACE) capsule 100 mg  100 mg Oral BID Ursula Alert, MD   100 mg at 09/20/13 0745  . gabapentin (NEURONTIN) capsule 300 mg  300 mg Oral TID Bellah Alia,  MD      . hydrOXYzine (ATARAX/VISTARIL) tablet 25 mg  25 mg Oral Q6H PRN Benjamine Mola, FNP   25 mg at 09/18/13 1429  . ibuprofen (ADVIL,MOTRIN) tablet 600 mg  600 mg Oral Q6H PRN Laverle Hobby, PA-C   600 mg at 09/19/13 0255  . LORazepam (ATIVAN) tablet 0-4 mg  0-4 mg Oral Q12H Shuvon Rankin, NP   2 mg at 09/20/13 0746  .  LORazepam (ATIVAN) tablet 1 mg  1 mg Oral Q8H PRN Shuvon Rankin, NP   1 mg at 09/19/13 1444  . magnesium hydroxide (MILK OF MAGNESIA) suspension 30 mL  30 mL Oral Daily PRN Shuvon Rankin, NP      . metFORMIN (GLUCOPHAGE) tablet 500 mg  500 mg Oral Q breakfast Shuvon Rankin, NP   500 mg at 09/20/13 0745  . nicotine (NICODERM CQ - dosed in mg/24 hours) patch 21 mg  21 mg Transdermal Daily Shuvon Rankin, NP      . ondansetron (ZOFRAN) tablet 4 mg  4 mg Oral Q8H PRN Shuvon Rankin, NP   4 mg at 09/19/13 0256  . [START ON 09/21/2013] QUEtiapine (SEROQUEL) tablet 200 mg  200 mg Oral QHS Candise Crabtree, MD      . thiamine (VITAMIN B-1) tablet 100 mg  100 mg Oral Daily Shuvon Rankin, NP   100 mg at 09/20/13 0745  . traZODone (DESYREL) tablet 100 mg  100 mg Oral QHS PRN Ursula Alert, MD      . venlafaxine XR (EFFEXOR-XR) 24 hr capsule 150 mg  150 mg Oral Once Ursula Alert, MD      . Derrill Memo ON 09/21/2013] venlafaxine XR (EFFEXOR-XR) 24 hr capsule 300 mg  300 mg Oral q morning - 10a Ursula Alert, MD        Lab Results:  Results for orders placed during the hospital encounter of 09/17/13 (from the past 48 hour(s))  GLUCOSE, CAPILLARY     Status: None   Collection Time    09/19/13  6:32 AM      Result Value Ref Range   Glucose-Capillary 85  70 - 99 mg/dL  GLUCOSE, CAPILLARY     Status: None   Collection Time    09/20/13  5:49 AM      Result Value Ref Range   Glucose-Capillary 84  70 - 99 mg/dL    Physical Findings: AIMS: Facial and Oral Movements Muscles of Facial Expression: None, normal Lips and Perioral Area: None, normal Jaw: None, normal Tongue: None, normal,Extremity Movements Upper (arms, wrists, hands, fingers): None, normal Lower (legs, knees, ankles, toes): None, normal, Trunk Movements Neck, shoulders, hips: None, normal, Overall Severity Severity of abnormal movements (highest score from questions above): None, normal Incapacitation due to abnormal movements: None,  normal Patient's awareness of abnormal movements (rate only patient's report): No Awareness, Dental Status Current problems with teeth and/or dentures?: No Does patient usually wear dentures?: No  CIWA:  CIWA-Ar Total: 8 COWS:  COWS Total Score: 4  Treatment Plan Summary: Daily contact with patient to assess and evaluate symptoms and progress in treatment Medication management  Plan:  Patient CONTINUES to need  inpatient stay and stabilization.  Will continue CIWA protocol, on ativan taper per CIWA.  Will continue effexor at the current dose ,but would combine it to once a day AM  dosing ,since she has been complaining of poor sleep (and effexor pm dose could be activating). Will continue her seroquel at current dose, but will change timing  to bedtime, since that could also help with sleep. However would be cautious on increasing dose at this time since she is withdrawing from ETOH and seroquel could lower her seizure threshold. Will continue trazodone as prn for sleep. Will increase gabapentin 300 mg po tid for anxiety sx.   Medical Decision Making Problem Points:  Established problem, worsening (2) and Review of last therapy session (1) Data Points:  Review or order clinical lab tests (1) Review or order medicine tests (1) Review of medication regiment & side effects (2) Review of new medications or change in dosage (2)  I certify that inpatient services furnished can reasonably be expected to improve the patient's condition.   Sheryl Landry 09/20/2013, 11:05 AM

## 2013-09-20 NOTE — [facility] Group Notes (Signed)
[facility] LCSW Group Therapy  09/20/2013  1:05 PM  Type of Therapy:  Group therapy  Participation Level:  Active  Participation Quality:  Attentive  Affect:  Flat  Cognitive:  Oriented  Insight:  Limited  Engagement in Therapy:  Limited  Modes of Intervention:  Discussion, Socialization  Summary of Progress/Problems:  Chaplain was here to lead a group on themes of hope and courage. Hope is when you care about someone and want them to be well.  "I hope I can get myself together to get myself in a better environment.  I have never been through this before, and I'm glad I took this step."  Got up and left after 20 minutes, returned 15 minutes later.  Says she feels hopeful here because hearing other's stories let her know she is not alone.    Roque Lias B 09/20/2013 12:10 PM

## 2013-09-20 NOTE — Progress Notes (Signed)
Patient ID: Sheryl Landry, female   DOB: May 01, 1960, 53 y.o.   MRN: [id number] D.Pt presents with irritable mood, affect congruent. Sheryl Landry reports that she continues to have intermittent withdrawal symptoms, including anxiety, nausea and tremor this morning. She also reports persistent auditory hallucinations '' there are two people fighting in my head, they are arguing and one of them called the other one fat. It has my nerves bad. '' Pt also reports poor sleep last night and very focused on ambien medication.  Patient has been isolative to her room throughout most of shift thus far. A. Medications given as ordered. Support and encouragement provided. Discussed above information and patients complaints with Dr. Shea Evans and treatment team. R. Patient has been able to be verbally redirected. Pt denies any further acute concerns and is in no acute distress at this time. Will continue to monitor q 15 minutes for safety.

## 2013-09-20 NOTE — Progress Notes (Signed)
[place] Group Notes:  (Nursing/MHT/Case Management/Adjunct)  Date:  09/20/2013  Time:  9:04 PM  Type of Therapy:  Psychoeducational Skills  Participation Level:  Active  Participation Quality:  Attentive  Affect:  Depressed  Cognitive:  Appropriate  Insight:  Good  Engagement in Group:  Engaged  Modes of Intervention:  Education  Summary of Progress/Problems: The patient stated that she had a "pretty productive" day. She states that she spent her time engaging in "soul searching" and reading her Bible. The patient acknowledges that she has been thinking about the reason for her admission to the hospital. In addition, the patient mentioned that she was grateful since her children came in to visit her this evening and that she appreciates their support. As a theme for the day, her relapse prevention will involve reading the Bible and watching less television.   Archie Balboa S 09/20/2013, 9:04 PM

## 2013-09-20 NOTE — Progress Notes (Signed)
Adult Psychoeducational Group Note  Date:  09/20/2013 Time:  12:38 AM  Group Topic/Focus:  Wrap-Up Group:   The focus of this group is to help patients review their daily goal of treatment and discuss progress on daily workbooks.  Participation Level:  Active  Participation Quality:  Monopolizing  Affect:  Labile  Cognitive:  Appropriate  Insight: Limited  Engagement in Group:  Engaged  Modes of Intervention:  Education  Additional Comments:  Patient was unable to rate today on a scale from 1 to 10 or tell one positive thing about today, but went on a tangent about how everyone here had come here because they had sinned against Christ, but that they could make up for that by changing things for themselves because, "only you can help you." Patient continued that people just need to pick themselves up and stop drinking because if you have a family you need to be there for them and strong women need to stay behind their men and women should never leave their men because that is all they have.  Sheryl Landry 09/20/2013, 12:38 AM

## 2013-09-20 NOTE — Progress Notes (Signed)
Patient ID: Sheryl Landry, female   DOB: 1960-08-31, 53 y.o.   MRN: [id number] D: Pt c/o insomnia and demanding at least three pills. Pt reports anxiety, headache, tremors and needed ativan. Pt endorses auditory and visual hallucination stating she sees two kids talking to her from her bible and two teenagers sitting in her room laughing at her. Pt denies suicidal /homicidal ideation intent and plan. Pt attended evening wrap up group and engaged in discussion. Pt denies any needs or concerns. Cooperative with assessment. No acute distressed noted at this time.   A: Met with pt 1:1. Medications administered as prescribed. Support and encouragement provided.   R: Patient is safe on the unit. She is complaint with medications and denies any adverse reaction. Continue current POC.

## 2013-09-21 DIAGNOSIS — F10239 Alcohol dependence with withdrawal, unspecified: Secondary | ICD-10-CM

## 2013-09-21 DIAGNOSIS — F10939 Alcohol use, unspecified with withdrawal, unspecified: Secondary | ICD-10-CM

## 2013-09-21 DIAGNOSIS — F17200 Nicotine dependence, unspecified, uncomplicated: Secondary | ICD-10-CM

## 2013-09-21 DIAGNOSIS — F1010 Alcohol abuse, uncomplicated: Secondary | ICD-10-CM

## 2013-09-21 LAB — GLUCOSE, CAPILLARY: Glucose-Capillary: 93 mg/dL (ref 70–99)

## 2013-09-21 MED ORDER — LORAZEPAM 1 MG PO TABS
1.0000 mg | ORAL_TABLET | Freq: Three times a day (TID) | ORAL | Status: DC | PRN
  Administered 2013-09-21 – 2013-09-22 (×3): 1 mg via ORAL
  Filled 2013-09-21 (×3): qty 1

## 2013-09-21 MED ORDER — HYDROXYZINE HCL 25 MG PO TABS
25.0000 mg | ORAL_TABLET | Freq: Four times a day (QID) | ORAL | Status: DC | PRN
  Administered 2013-09-21 – 2013-09-22 (×2): 25 mg via ORAL
  Filled 2013-09-21 (×2): qty 1

## 2013-09-21 MED ORDER — OLANZAPINE 10 MG PO TBDP
5.0000 mg | ORAL_TABLET | Freq: Three times a day (TID) | ORAL | Status: DC | PRN
  Administered 2013-09-21 – 2013-09-22 (×3): 5 mg via ORAL
  Filled 2013-09-21 (×3): qty 1

## 2013-09-21 MED ORDER — QUETIAPINE FUMARATE 300 MG PO TABS
300.0000 mg | ORAL_TABLET | Freq: Every day | ORAL | Status: DC
  Administered 2013-09-21 – 2013-09-22 (×2): 300 mg via ORAL
  Filled 2013-09-21 (×3): qty 1
  Filled 2013-09-21: qty 14
  Filled 2013-09-21: qty 1

## 2013-09-21 NOTE — Progress Notes (Signed)
The focus of this group is to help patients review their daily goal of treatment and discuss progress on daily workbooks. Pt did not attend the evening group. 

## 2013-09-21 NOTE — Progress Notes (Signed)
Chart reviewed. Case discussed with NP. Agree with above assessment and plan.  Dereck Leep, MD

## 2013-09-21 NOTE — Progress Notes (Signed)
Patient ID: Sheryl Landry, female   DOB: [birth year], 53 y.o.   MRN: [id number] Psychoeducational Group Note  Date:  09/21/2013 Time:0920am  Group Topic/Focus:  Identifying Needs:   The focus of this group is to help patients identify their personal needs that have been historically problematic and identify healthy behaviors to address their needs.  Participation Level:  Active  Participation Quality:  Appropriate  Affect:  Appropriate  Cognitive:  Appropriate  Insight:  Monopolizing  Engagement in Group:  Monopolizing  Additional Comments:  Healthy coping skills.   Pricilla Larsson 09/21/2013,11:16 AM

## 2013-09-21 NOTE — Progress Notes (Signed)
Patient ID: Sheryl Landry, female   DOB: May 05, 1960, 53 y.o.   MRN: [id number] D.Pt presents with irritable mood, affect labile today. Sheryl Landry continues to be very irritable, demanding and easily agitated today. This morning Sheryl Landry reports that she continues to have persistent auditory hallucinations stating '' there are two people in my head, they bother me all the time. I get so agitated, and that other man in the hall was talking to me - he started cussing me and I will tear his ass up if I have to. '' She states - '' and all the snoring kept me up las tnight and I need my medications adjusted because I can't sleep at night and I kept waking up. I've got the rage inside me and these doctors need to do something! '' Patient clearly unable to tolerate much stimulation on the unit, and remains demanding at this time. A. Medications given as ordered., including prn medications for agitation.  Support and encouragement provided. Discussed above information and patients complaints with Dianna Rossetti NP.Marland KitchenPt denies any further acute concerns and is in no acute distress at this time. Will continue to monitor q 15 minutes for safety.

## 2013-09-21 NOTE — [facility] Group Notes (Signed)
[place] Group Notes:  (Clinical Social Work)  09/21/2013  11:15-12:00PM  Summary of Progress/Problems:   The main focus of today's process group was to discuss patients' feelings about hospitalization, the stigma attached to mental health, and sources of motivation to stay well.  We then worked to identify a specific plan to avoid future hospitalizations when discharged from the hospital for this admission.  The patient expressed that she appreciates the help she is getting in the hospital, and that she feels she needed to be in the hospital.  She also feels she has been here long enough, and is embarrassed at what people back at her home will think of her being gone so long.  She does not feel she is listened to as closely sa she would like.  Type of Therapy:  Group Therapy - Process  Participation Level:  Active  Participation Quality:  Attentive and Sharing  Affect:  Blunted  Cognitive:  Alert and Appropriate  Insight:  Developing/Improving  Engagement in Therapy:  Developing/Improving  Modes of Intervention:  Exploration, Discussion  Selmer Dominion, LCSW 09/21/2013, 12:56 PM

## 2013-09-21 NOTE — Progress Notes (Signed)
Patient ID: Sheryl Landry, female   DOB: 05/03/1960, 53 y.o.   MRN: [id number] [facility] MD Progress Note  09/21/2013 3:15 PM TIENA Landry  MRN:  [id number] Subjective:  Patient states "I hear people talking. Sometimes its like there are four people in the room. But I know that nobody is here with me. I also saw some kids playing cards on the bed. There were a guy and a girl talking about going on a date. I feel depressed about all this. I am also very scared."   Objective: Patient is seen and chart is reviewed. The patient reports an increase in her psychotic symptoms. She is experiencing severe auditory and visual hallucinations. Patient feels that her depression and anxiety have improved since admission. Rates depression at four today.  She is grateful to be receiving help for her symptoms. Patient reports being motivated to get better for her husband and children. So far patient has been compliant with medications and denies any adverse effects. Patient is requesting an adjustment to her medications. Discussed case with Dr. Aneta Mins along with medication adjustments.   Diagnosis:   DSM5:  Primary psychiatric diagnosis:  Generalized anxiety disorder   Secondary psychiatric diagnosis:  Alcohol use disorder  Alcohol withdrawal  Tobacco use disorder   Non psychiatric diagnosis:  Diabetes Mellitus  Breast ca, s/p mastectomy (left  ADL's:  Intact  Sleep: Fair  Appetite:  Good  Suicidal Ideation:  Denies Homicidal Ideation:  Denies  AEB (as evidenced by):  Psychiatric Specialty Exam: Physical Exam  Constitutional: She is oriented to person, place, and time. She appears well-developed and well-nourished.  HENT:  Head: Normocephalic and atraumatic.  Eyes: Conjunctivae are normal. Pupils are equal, round, and reactive to light.  Neck: Normal range of motion.  Cardiovascular: Normal rate and regular rhythm.   Respiratory: Effort normal.  GI: Soft.  Musculoskeletal: Normal range of  motion.  Neurological: She is alert and oriented to person, place, and time.  Skin: Skin is warm.  Psychiatric: Her speech is normal and behavior is normal. Her mood appears anxious. Cognition and memory are normal. She expresses impulsivity. She exhibits a depressed mood. She expresses suicidal ideation.    Review of Systems  Constitutional: Negative.   HENT: Negative.   Eyes: Negative.   Respiratory: Negative.   Cardiovascular: Negative.   Gastrointestinal: Negative.   Genitourinary: Negative.   Musculoskeletal: Positive for myalgias.  Skin: Negative.   Neurological: Negative.   Endo/Heme/Allergies: Negative.   Psychiatric/Behavioral: Positive for suicidal ideas and hallucinations. The patient is nervous/anxious and has insomnia.     Blood pressure 141/74, pulse 86, temperature 97.9 F (36.6 C), temperature source Oral, resp. rate 16, height 5\' 3"  (1.6 m), weight 61.236 kg (135 lb), SpO2 98.00%.Body mass index is 23.92 kg/(m^2).  General Appearance: Disheveled  Eye Contact::  Good  Speech:  Normal Rate  Volume:  Normal  Mood:  Anxious and Depressed  Affect:  Congruent  Thought Process:  Goal Directed  Orientation:  Full (Time, Place, and Person)  Thought Content:  Hallucinations: Auditory Visual  Suicidal Thoughts:  Yes.  without intent/plan  Homicidal Thoughts:  No  Memory:  Immediate;   Fair Recent;   Fair Remote;   Fair  Judgement:  Impaired  Insight:  Lacking  Psychomotor Activity:  Normal  Concentration:  Poor  Recall:  [employer] of Knowledge:Fair  Language: Good  Akathisia:  No    AIMS (if indicated):   0  Assets:  Communication Skills  Desire for Improvement Financial Resources/Insurance  Sleep:  Number of Hours: 5   Musculoskeletal: Strength & Muscle Tone: within normal limits Gait & Station: normal Patient leans: N/A  Current Medications: Current Facility-Administered Medications  Medication Dose Route Frequency Provider Last Rate Last Dose  .  docusate sodium (COLACE) capsule 100 mg  100 mg Oral BID Ursula Alert, MD   100 mg at 09/21/13 0754  . gabapentin (NEURONTIN) capsule 300 mg  300 mg Oral TID Ursula Alert, MD   300 mg at 09/21/13 1222  . hydrOXYzine (ATARAX/VISTARIL) tablet 25 mg  25 mg Oral Q6H PRN Ursula Alert, MD      . ibuprofen (ADVIL,MOTRIN) tablet 600 mg  600 mg Oral Q6H PRN Laverle Hobby, PA-C   600 mg at 09/19/13 0255  . LORazepam (ATIVAN) tablet 1 mg  1 mg Oral Q8H PRN Saramma Eappen, MD      . magnesium hydroxide (MILK OF MAGNESIA) suspension 30 mL  30 mL Oral Daily PRN Shuvon Rankin, NP      . metFORMIN (GLUCOPHAGE) tablet 500 mg  500 mg Oral Q breakfast Shuvon Rankin, NP   500 mg at 09/21/13 0755  . nicotine (NICODERM CQ - dosed in mg/24 hours) patch 21 mg  21 mg Transdermal Daily Shuvon Rankin, NP      . ondansetron (ZOFRAN) tablet 4 mg  4 mg Oral Q8H PRN Shuvon Rankin, NP   4 mg at 09/19/13 0256  . QUEtiapine (SEROQUEL) tablet 200 mg  200 mg Oral QHS Saramma Eappen, MD      . thiamine (VITAMIN B-1) tablet 100 mg  100 mg Oral Daily Shuvon Rankin, NP   100 mg at 09/21/13 0754  . traZODone (DESYREL) tablet 100 mg  100 mg Oral QHS PRN Ursula Alert, MD   100 mg at 09/20/13 2102  . venlafaxine XR (EFFEXOR-XR) 24 hr capsule 300 mg  300 mg Oral q morning - 10a Ursula Alert, MD   300 mg at 09/21/13 6578    Lab Results:  Results for orders placed during the hospital encounter of 09/17/13 (from the past 48 hour(s))  GLUCOSE, CAPILLARY     Status: None   Collection Time    09/20/13  5:49 AM      Result Value Ref Range   Glucose-Capillary 84  70 - 99 mg/dL  GLUCOSE, CAPILLARY     Status: None   Collection Time    09/21/13  6:10 AM      Result Value Ref Range   Glucose-Capillary 93  70 - 99 mg/dL    Physical Findings: AIMS: Facial and Oral Movements Muscles of Facial Expression: None, normal Lips and Perioral Area: None, normal Jaw: None, normal Tongue: None, normal,Extremity Movements Upper (arms,  wrists, hands, fingers): None, normal Lower (legs, knees, ankles, toes): None, normal, Trunk Movements Neck, shoulders, hips: None, normal, Overall Severity Severity of abnormal movements (highest score from questions above): None, normal Incapacitation due to abnormal movements: None, normal Patient's awareness of abnormal movements (rate only patient's report): No Awareness, Dental Status Current problems with teeth and/or dentures?: No Does patient usually wear dentures?: No  CIWA:  CIWA-Ar Total: 5 COWS:  COWS Total Score: 4  Treatment Plan Summary: Daily contact with patient to assess and evaluate symptoms and progress in treatment Medication management  Plan: 1. Continue crisis management and stabilization.  2. Medication management:  -Increase Seroquel to 300 mg hs for psychosis -Start Zyprexa Zydis 5 mg every eight hours prn acute psychotic symptoms.  -  Continue Neurontin 300 mg TID for anxiety/improved mood stability -Continue Effexor 300 mg every morning for anxiety/depression -Continue Ativan 1 mg every eight hours prn agitation/withdrawal symptoms  3. Encouraged patient to attend groups and participate in group counseling sessions and activities.  4. Discharge plan in progress.  5. Continue current treatment plan.  6. Address health issues: Continue daily blood sugar monitoring along with Metformin 500 mg daily with breakfast for Diabetic management.   Medical Decision Making Problem Points:  Established problem, worsening (2) and Review of last therapy session (1) Data Points:  Review or order clinical lab tests (1) Review or order medicine tests (1) Review of medication regiment & side effects (2) Review of new medications or change in dosage (2)  I certify that inpatient services furnished can reasonably be expected to improve the patient's condition.   Mariela Rex NP-C 09/21/2013, 3:15 PM

## 2013-09-21 NOTE — Progress Notes (Signed)
Patient ID: Sheryl Landry, female   DOB: 1960-09-15, 53 y.o.   MRN: [id number] Psychoeducational Group Note  Date:  09/21/2013 Time:0900am  Group Topic/Focus:  Identifying Needs:   The focus of this group is to help patients identify their personal needs that have been historically problematic and identify healthy behaviors to address their needs.  Participation Level:  Active  Participation Quality:  Appropriate  Affect:  Appropriate  Cognitive:  Appropriate  Insight:  Monopolizing  Engagement in Group:  Monopolizing  Additional Comments:  Inventory group   Pricilla Larsson 09/21/2013,11:15 AM

## 2013-09-22 DIAGNOSIS — R45851 Suicidal ideations: Secondary | ICD-10-CM

## 2013-09-22 DIAGNOSIS — F411 Generalized anxiety disorder: Secondary | ICD-10-CM

## 2013-09-22 LAB — GLUCOSE, CAPILLARY: Glucose-Capillary: 78 mg/dL (ref 70–99)

## 2013-09-22 NOTE — [facility] Group Notes (Signed)
[place] Group Notes:  (Nursing/MHT/Case Management/Adjunct)  Date:  09/22/2013  Time:  10:38 AM  Type of Therapy:  Psychoeducational Skills --self inventory review  Participation Level:  Did Not Attend  Participation Quality:  na  Affect:  na  Cognitive:  na  Insight:  None  Engagement in Group:  na  Modes of Intervention:  na  Summary of Progress/Problems:  Leonia Reader 09/22/2013, 10:38 AM

## 2013-09-22 NOTE — Progress Notes (Addendum)
Patient ID: Sheryl Landry, female   DOB: November 06, 1960, 53 y.o.   MRN: [id number] D. Patient presents with irritable mood, affect labile. Sheryl Landry reports that she feels her mood is improving, and reports auditory hallucinations have improved, none present during the night or this morning. She continues to be easily agitated at times however, and reports continued anxiety. She denies any SI/HI. Sheryl Landry also continues to be med seeking at times, asking repeatedly what medications are due next, and '' will this knock me out, I want something that will knock me out. '' Pt later in shift reported AH stating ''they are in the room with me, talking about me. It's aggravating'' refused to complete self inventory.A. Medications given as ordered, including prn medications given for anxiety. Discussed above information with Dr. Aneta Mins, and Reginold Agent NP. R. Patient is visible in the milieu, and denies any acute concerns at this time. Will continue to monitor as ordered.

## 2013-09-22 NOTE — Progress Notes (Signed)
Spoke with patient 1:1. Affect is appropriate however mood irritable. States she had visual hallucinations at dinner - "there was a man sitting in front of me in a navy blue suit." She denies AH tonight. On observation patient does not appear to respond to internal stimuli. Medicated per orders and given ativan and trazadone prn per her request. On reassessment, both complaints are decreased. Denies SI/HI and is safe. Jamie Kato

## 2013-09-22 NOTE — Progress Notes (Signed)
Patient ID: Sheryl Landry, female   DOB: [birth year], 53 y.o.   MRN: [id number] [facility] MD Progress Note  09/22/2013 2:11 PM Sheryl Landry  MRN:  [id number] Subjective: " I am worried about hearing voices and seeing things, it all started yesterday"  Objective:  Patient was seen this afternoon who reported an increase in her auditory and visual hallucinations.  Patient reported seeing two children climb into bed with her wanting to color pictures.  She reported hearing voices telling her that what she was wearing was not fancy enough.  Patient also reported that auditory and visual hallucination are new to her.  She denied family hx of mental illness and stated she has never taken any mental illness medications before.  Patient reported that she was in a lot of stress before coming into the hospital and that she is having difficulty getting a job and as a result is having financial difficulty too.  Patient  Admitted that she has been using alcohol to treat her depression and stated that she will like to continue treatment for alcoholism when she leaves the hospital.  She denied SI/HI/AVH and she reported good night sleep.  We will continue using the medication combination we have in place.  We will continue with our plan of care.  Diagnosis:   DSM5:  Primary psychiatric diagnosis:  Generalized anxiety disorder   Secondary psychiatric diagnosis:  Alcohol use disorder  Alcohol withdrawal  Tobacco use disorder   Non psychiatric diagnosis:  Diabetes Mellitus  Breast ca, s/p mastectomy (left  ADL's:  Intact  Sleep: Fair  Appetite:  Good  Suicidal Ideation:  Denies Homicidal Ideation:  Denies  Psychiatric Specialty Exam: Physical Exam  Constitutional: She is oriented to person, place, and time. She appears well-developed and well-nourished.  HENT:  Head: Normocephalic and atraumatic.  Eyes: Conjunctivae are normal. Pupils are equal, round, and reactive to light.  Neck: Normal range of  motion.  Cardiovascular: Normal rate and regular rhythm.   Respiratory: Effort normal.  GI: Soft.  Musculoskeletal: Normal range of motion.  Neurological: She is alert and oriented to person, place, and time.  Skin: Skin is warm.  Psychiatric: Her speech is normal and behavior is normal. Her mood appears anxious. Cognition and memory are normal. She expresses impulsivity. She exhibits a depressed mood. She expresses suicidal ideation.    Review of Systems  Constitutional: Negative.   HENT: Negative.   Eyes: Negative.   Respiratory: Negative.   Cardiovascular: Negative.   Gastrointestinal: Negative.   Genitourinary: Negative.   Musculoskeletal: Positive for myalgias.  Skin: Negative.   Neurological: Negative.   Endo/Heme/Allergies: Negative.   Psychiatric/Behavioral: Positive for suicidal ideas and hallucinations. The patient is nervous/anxious and has insomnia.     Blood pressure 130/64, pulse 67, temperature 97.9 F (36.6 C), temperature source Oral, resp. rate 18, height 5\' 3"  (1.6 m), weight 61.236 kg (135 lb), SpO2 98.00%.Body mass index is 23.92 kg/(m^2).  General Appearance: Moderately groomed, casual and well nourished  Eye Contact::  Good  Speech:  Normal Rate  Volume:  Normal  Mood:  Anxious and Depressed  Affect:  Congruent  Thought Process:  Goal Directed  Orientation:  Full (Time, Place, and Person)  Thought Content:  Hallucinations: Auditory Visual  Suicidal Thoughts:  Yes.  without intent/plan  Homicidal Thoughts:  No  Memory:  Immediate;   Fair Recent;   Fair Remote;   Fair  Judgement:  Impaired  Insight:  Lacking  Psychomotor Activity:  Normal  Concentration:  Poor  Recall:  [employer] of Knowledge:Fair  Language: Good  Akathisia:  No    AIMS (if indicated):   0  Assets:  Communication Skills Desire for Improvement Financial Resources/Insurance  Sleep:  Number of Hours: 5.75   Musculoskeletal: Strength & Muscle Tone: within normal limits Gait &  Station: normal Patient leans: N/A  Current Medications: Current Facility-Administered Medications  Medication Dose Route Frequency Provider Last Rate Last Dose  . docusate sodium (COLACE) capsule 100 mg  100 mg Oral BID Ursula Alert, MD   100 mg at 09/22/13 0817  . gabapentin (NEURONTIN) capsule 300 mg  300 mg Oral TID Ursula Alert, MD   300 mg at 09/22/13 1202  . hydrOXYzine (ATARAX/VISTARIL) tablet 25 mg  25 mg Oral Q6H PRN Ursula Alert, MD   25 mg at 09/21/13 1531  . ibuprofen (ADVIL,MOTRIN) tablet 600 mg  600 mg Oral Q6H PRN Laverle Hobby, PA-C   600 mg at 09/19/13 0255  . LORazepam (ATIVAN) tablet 1 mg  1 mg Oral Q8H PRN Ursula Alert, MD   1 mg at 09/22/13 0819  . magnesium hydroxide (MILK OF MAGNESIA) suspension 30 mL  30 mL Oral Daily PRN Shuvon Rankin, NP      . metFORMIN (GLUCOPHAGE) tablet 500 mg  500 mg Oral Q breakfast Shuvon Rankin, NP   500 mg at 09/22/13 0818  . nicotine (NICODERM CQ - dosed in mg/24 hours) patch 21 mg  21 mg Transdermal Daily Shuvon Rankin, NP      . OLANZapine zydis (ZYPREXA) disintegrating tablet 5 mg  5 mg Oral Q8H PRN Elmarie Shiley, NP   5 mg at 09/22/13 1202  . ondansetron (ZOFRAN) tablet 4 mg  4 mg Oral Q8H PRN Shuvon Rankin, NP   4 mg at 09/19/13 0256  . QUEtiapine (SEROQUEL) tablet 300 mg  300 mg Oral QHS Elmarie Shiley, NP   300 mg at 09/21/13 2149  . thiamine (VITAMIN B-1) tablet 100 mg  100 mg Oral Daily Shuvon Rankin, NP   100 mg at 09/22/13 0818  . traZODone (DESYREL) tablet 100 mg  100 mg Oral QHS PRN Ursula Alert, MD   100 mg at 09/21/13 2149  . venlafaxine XR (EFFEXOR-XR) 24 hr capsule 300 mg  300 mg Oral q morning - 10a Ursula Alert, MD   300 mg at 09/22/13 2952    Lab Results:  Results for orders placed during the hospital encounter of 09/17/13 (from the past 48 hour(s))  GLUCOSE, CAPILLARY     Status: None   Collection Time    09/21/13  6:10 AM      Result Value Ref Range   Glucose-Capillary 93  70 - 99 mg/dL  GLUCOSE,  CAPILLARY     Status: None   Collection Time    09/22/13  6:29 AM      Result Value Ref Range   Glucose-Capillary 78  70 - 99 mg/dL   Comment 1 Notify RN      Physical Findings: AIMS: Facial and Oral Movements Muscles of Facial Expression: None, normal Lips and Perioral Area: None, normal Jaw: None, normal Tongue: None, normal,Extremity Movements Upper (arms, wrists, hands, fingers): None, normal Lower (legs, knees, ankles, toes): None, normal, Trunk Movements Neck, shoulders, hips: None, normal, Overall Severity Severity of abnormal movements (highest score from questions above): None, normal Incapacitation due to abnormal movements: None, normal Patient's awareness of abnormal movements (rate only patient's report): No Awareness, Dental Status Current problems with  teeth and/or dentures?: No Does patient usually wear dentures?: No  CIWA:  CIWA-Ar Total: 5 COWS:  COWS Total Score: 4  Treatment Plan Summary: Daily contact with patient to assess and evaluate symptoms and progress in treatment Medication management  Plan: 1. Continue crisis management and stabilization.  2. Medication management:  -Increase Seroquel to 300 mg hs for psychosis -Start Zyprexa Zydis 5 mg every eight hours prn acute psychotic symptoms.  -Continue Neurontin 300 mg TID for anxiety/improved mood stability -Continue Effexor 300 mg every morning for anxiety/depression -Continue Ativan 1 mg every eight hours prn agitation/withdrawal symptoms  3. Encouraged patient to attend groups and participate in group counseling sessions and activities.  4. Discharge plan in progress.  5. Continue current treatment plan.  6. Address health issues: Continue daily blood sugar monitoring along with Metformin 500 mg daily with breakfast for Diabetic management.   Medical Decision Making Problem Points:  Established problem, worsening (2) and Review of last therapy session (1) Data Points:  Review or order clinical lab  tests (1) Review or order medicine tests (1) Review of medication regiment & side effects (2) Review of new medications or change in dosage (2)  I certify that inpatient services furnished can reasonably be expected to improve the patient's condition.   Charmaine Downs, C  PMHNP-BC 09/22/2013, 2:11 PM

## 2013-09-22 NOTE — [facility] Group Notes (Signed)
[place] Group Notes:  (Nursing/MHT/Case Management/Adjunct)  Date:  09/22/2013  Time:  0930  Type of Therapy:  Psychoeducational Skills  Participation Level:  Did Not Attend  Participation Quality:  na  Affect:  na  Cognitive:  na  Insight:  None  Engagement in Group:  na  Modes of Intervention:  na  Summary of Progress/Problems:  Sheryl Landry 09/22/2013, 10:37 AM

## 2013-09-22 NOTE — [facility] Group Notes (Signed)
[place] Group Notes:  (Clinical Social Work)  09/22/2013   11:15am-12:00pm  Summary of Progress/Problems:  The main focus of today's process group was to listen to a variety of genres of music and to identify that different types of music provoke different responses.  The patient then was able to identify personally what was soothing for them, as well as energizing.  Handouts were used to record feelings evoked, as well as how patient can personally use this knowledge in sleep habits, with depression, and with other symptoms.  The patient expressed understanding of concepts, as well as knowledge of how each type of music affected him/her and how this can be used at home as a wellness/recovery tool.  Type of Therapy:  Music Therapy   Participation Level:  Active  Participation Quality:  Attentive and Sharing  Affect:  Blunted  Cognitive:  Oriented  Insight:  Engaged  Engagement in Therapy:  Engaged  Modes of Intervention:   Activity, Exploration  Selmer Dominion, LCSW 09/22/2013, 12:30pm

## 2013-09-23 DIAGNOSIS — F1020 Alcohol dependence, uncomplicated: Principal | ICD-10-CM

## 2013-09-23 LAB — GLUCOSE, CAPILLARY: Glucose-Capillary: 126 mg/dL — ABNORMAL HIGH (ref 70–99)

## 2013-09-23 MED ORDER — QUETIAPINE FUMARATE 300 MG PO TABS: 300.0000 mg | ORAL_TABLET | Freq: Every day | ORAL | Status: DC

## 2013-09-23 MED ORDER — HYDROXYZINE HCL 25 MG PO TABS
25.0000 mg | ORAL_TABLET | Freq: Three times a day (TID) | ORAL | Status: DC | PRN
  Filled 2013-09-23: qty 30

## 2013-09-23 MED ORDER — LORAZEPAM 1 MG PO TABS: 1.0000 mg | ORAL_TABLET | Freq: Three times a day (TID) | ORAL | Status: DC | PRN

## 2013-09-23 MED ORDER — TRAZODONE HCL 100 MG PO TABS: 100.0000 mg | ORAL_TABLET | Freq: Every evening | ORAL | Status: DC | PRN

## 2013-09-23 MED ORDER — GABAPENTIN 300 MG PO CAPS: 300.0000 mg | ORAL_CAPSULE | Freq: Three times a day (TID) | ORAL | Status: DC

## 2013-09-23 MED ORDER — DSS 100 MG PO CAPS: 100.0000 mg | ORAL_CAPSULE | Freq: Two times a day (BID) | ORAL | Status: DC

## 2013-09-23 MED ORDER — VENLAFAXINE HCL ER 150 MG PO CP24: 300.0000 mg | ORAL_CAPSULE | Freq: Every morning | ORAL | Status: DC

## 2013-09-23 MED ORDER — HYDROXYZINE HCL 25 MG PO TABS: ORAL_TABLET | ORAL | Status: DC

## 2013-09-23 MED ORDER — METFORMIN HCL 500 MG PO TABS: 500.0000 mg | ORAL_TABLET | Freq: Every day | ORAL | Status: DC

## 2013-09-23 NOTE — Tx Team (Signed)
  Date: 09/23/2013 10:27 AM  Progress in Treatment:  Attending groups: Yes Participating in groups: Yes Taking medication as prescribed: Yes  Tolerating medication: Yes  Family/Significant othe contact made: Yes, Pt's husband contacted  Patient understands diagnosis: Yes Discussing patient identified problems/goals with staff: Yes  Medical problems stabilized or resolved: Yes  Denies suicidal/homicidal ideation: Yes Patient has not harmed self or Others: Yes   New problem(s) identified: none at this time  Discharge Plan or Barriers: Pt is stable for d/c today, will return home with husband, and will follow-up at [facility] for medication management and therapy.  Additional comments: Sheryl Landry is an 53 y.o. female with SI. The patient was brought in by police with IVC paperwork by the patients family. Patient heavily intoxicated. Apparently making threats to shoot herself in the head. Patient is extremely loud and agitated. Alcoholic rage. Inappropriate comments. Documentation in epic reports that the patient was cursing at staff and sheriff. Per IVC papers, pt is abusing alcohol, pt's husband reports pt has verbalized taking a gun to her head. Pt with hx of depression. She received Geodon IM prior to coming back to the unit.    Reason for Continuation of Hospitalization:  Anxiety Depression Medication stabilization Suicidal ideation/Homocidal ideation  Withdrawal symptoms Psychotic  Estimated length of stay: 0 days; Pt stable for discharge.  For review of initial/current patient goals, please see plan of care.   Attendees:  Attendees:  Patient:    Family:    Physician: Dr. Darleene Cleaver, MD; Dr. Shea Evans, MD  09/23/2013 10:27 AM  Nursing: Marcello Moores, RN; Maudie Mercury, RN  09/23/2013 10:27 AM  Clinical Social Worker Rod Bobtown,   09/23/2013 10:27 AM  Clinical Social Worker Peri Maris, Latanya Presser   09/23/2013 10:27 AM  Other:   09/23/2013 10:27 AM  Other: , RN Charge Nurse  09/23/2013 10:27 AM  Other:     Peri Maris, Latanya Presser MSW

## 2013-09-23 NOTE — Discharge Summary (Signed)
Physician Discharge Summary Note  Patient:  Sheryl Landry is an 53 y.o., female MRN:  [id number] DOB:  Jan 31, 1961 Patient phone:  [phone] (home)  Patient address:   [place] [address],  Total Time spent with patient: Greater than 30 minutes  Date of Admission:  09/17/2013 Date of Discharge: 09/23/13  Reason for Admission: Alcohol detox/mood stabilization  Discharge Diagnoses: Active Problems:   S/P alcohol detoxification   Homicidal ideation   Generalized anxiety disorder   Panic attacks  Psychiatric Specialty Exam: Physical Exam  Psychiatric: Her speech is normal and behavior is normal. Judgment and thought content normal. Her mood appears not anxious. Her affect is not angry, not blunt, not labile and not inappropriate. Cognition and memory are normal. She does not exhibit a depressed mood.    Review of Systems  Constitutional: Negative.   HENT: Negative.   Eyes: Negative.   Respiratory: Negative.   Cardiovascular: Negative.   Gastrointestinal: Negative.   Genitourinary: Negative.   Musculoskeletal: Negative.   Skin: Negative.   Neurological: Negative.   Endo/Heme/Allergies: Negative.   Psychiatric/Behavioral: Positive for depression (Stable) and substance abuse (Alcoholism, chronic). Negative for suicidal ideas, hallucinations and memory loss. The patient has insomnia (Stable). The patient is not nervous/anxious.     Blood pressure 135/85, pulse 78, temperature 98.7 F (37.1 C), temperature source Oral, resp. rate 18, height 5\' 3"  (1.6 m), weight 61.236 kg (135 lb), SpO2 98.00%.Body mass index is 23.92 kg/(m^2).   General Appearance: Casual   Eye Contact:: Good   Speech: Clear and Coherent   Volume: Normal   Mood: Euthymic   Affect: Congruent   Thought Process: Goal Directed   Orientation: Full (Time, Place, and Person)   Thought Content: denies AH/VH/Paranoia   Suicidal Thoughts: No   Homicidal Thoughts: No   Memory: Immediate; Fair   Recent; Fair  Remote; Fair   Judgement: Fair   Insight: Fair   Psychomotor Activity: Normal   Concentration: Fair   Recall: [employer] of Knowledge:Good   Language: Fair   Akathisia: No     AIMS (if indicated): 0   Assets: Communication Skills  Desire for Improvement  Housing  Intimacy  Social Support   Sleep: Number of Hours: 5.75    Past Psychiatric History: Diagnosis: Alcohol dependence, Generalized anxiety disorder  Hospitalizations: [place] adult unit  Outpatient Care: [facility]  Substance Abuse Care: [facility]  Self-Mutilation: NA  Suicidal Attempts: NA  Violent Behaviors: NA   Musculoskeletal: Strength & Muscle Tone: within normal limits Gait & Station: normal Patient leans: N/A  DSM5: Schizophrenia Disorders:  NA Obsessive-Compulsive Disorders:  NA Trauma-Stressor Disorders:  NA Substance/Addictive Disorders:  Alcohol Related Disorder - Moderate (303.90) Depressive Disorders:  GAD  Axis Diagnosis:   AXIS I:  Alcohol dependence, Generalized anxiety disorder AXIS II:  Deferred AXIS III:   Past Medical History  Diagnosis Date  . Diabetes mellitus without complication   . Cancer     breast   AXIS IV:  other psychosocial or environmental problems and Alcoholism, chronic AXIS V:  63  Level of Care:  OP  Hospital Course:  Patient presented to [facility] on 8/17 and was thereafter transferred to OBS ,where she appeared to be very irritable,aggressive ,demanding . Patient currently detoxified . Patient reports a history of alcohol abuse since the past 1 year. Reports that she initially started using liquor and then started drinking wine to the point that she would  drink 5-6 bottles a day.  Sheryl Landry was admitted to the hospital for alcohol detoxification treatment. Her blood alcohol level upon admission was 265 per toxicology tests reports. She was intoxicated, requiring detoxification treatment to re-stabilize her systems of alcohol  intoxication. This was achieved using Ativan detoxification regimen on a tapering dose format. By using Sheryl Landry received a cleaner detox treatment without the lingering adverse effects of the long acting Librium capsules, had it been that Librium detox protocols were used.  Besides the detox treatment, Sheryl Landry also was medicated and discharged on Neurontin 300 mg three times daily for substance withdrawal syndrome, Hydroxyzine 25 mg three times daily as needed for anxiety, Seroquel 300 mg Q bedtime for mood control, Trazodone 100 mg  mg Q bedtime for sleep and Effexor XR 300 mg daily for depression. She also received other medication management for her other medical issues that she presented. She tolerated her treatment regimen without any significant adverse effects and or reactions. She participated in the AA/NA meetings and group counseling sessions being offered and held on this unit. She learned coping skills.  Sheryl Landry has completed detox treatment. She is being discharged to her home to continue treatment on an outpatient basis at the [facility]. She has been given all the necessary information needed to make this appointment without problems. Upon discharge, she denies any SIHI, AVH, delusional thoughts, paranoia and or withdrawal symptoms. She received some samples of her [facility] discharge medications. She left [facility] with all personal belongings in no distress. Transportation per husband.  Consults:  psychiatry  Significant Diagnostic Studies:  labs: CBC with diff, CMP, UDS, toxicology tests, U/A  Discharge Vitals:   Blood pressure 135/85, pulse 78, temperature 98.7 F (37.1 C), temperature source Oral, resp. rate 18, height 5\' 3"  (1.6 m), weight 61.236 kg (135 lb), SpO2 98.00%. Body mass index is 23.92 kg/(m^2). Lab Results:   Results for orders placed during the hospital encounter of 09/17/13 (from the past 72 hour(s))  GLUCOSE, CAPILLARY     Status: None   Collection Time     09/21/13  6:10 AM      Result Value Ref Range   Glucose-Capillary 93  70 - 99 mg/dL  GLUCOSE, CAPILLARY     Status: None   Collection Time    09/22/13  6:29 AM      Result Value Ref Range   Glucose-Capillary 78  70 - 99 mg/dL   Comment 1 Notify RN    GLUCOSE, CAPILLARY     Status: Abnormal   Collection Time    09/23/13  6:21 AM      Result Value Ref Range   Glucose-Capillary 126 (*) 70 - 99 mg/dL    Physical Findings: AIMS: Facial and Oral Movements Muscles of Facial Expression: None, normal Lips and Perioral Area: None, normal Jaw: None, normal Tongue: None, normal,Extremity Movements Upper (arms, wrists, hands, fingers): None, normal Lower (legs, knees, ankles, toes): None, normal, Trunk Movements Neck, shoulders, hips: None, normal, Overall Severity Severity of abnormal movements (highest score from questions above): None, normal Incapacitation due to abnormal movements: None, normal Patient's awareness of abnormal movements (rate only patient's report): No Awareness, Dental Status Current problems with teeth and/or dentures?: No Does patient usually wear dentures?: No  CIWA:  CIWA-Ar Total: 5 COWS:  COWS Total Score: 4  Psychiatric Specialty Exam: See Psychiatric Specialty Exam and Suicide Risk Assessment completed by Attending Physician prior to discharge.  Discharge destination:  Home  Is patient on  multiple antipsychotic therapies at discharge:  No   Has Patient had three or more failed trials of antipsychotic monotherapy by history:  No  Recommended Plan for Multiple Antipsychotic Therapies: NA    Medication List    STOP taking these medications       amitriptyline 25 MG tablet  Commonly known as:  ELAVIL      TAKE these medications     Indication   DSS 100 MG Caps  Take 100 mg by mouth 2 (two) times daily. (May purchase from over the counter at your local pharmacy): For constipation   Indication:  Constipation     gabapentin 300 MG capsule   Commonly known as:  NEURONTIN  Take 1 capsule (300 mg total) by mouth 3 (three) times daily. For substance withdrawal syndrome   Indication:  Agitation, Alcohol Withdrawal Syndrome     hydrOXYzine 25 MG tablet  Commonly known as:  ATARAX/VISTARIL  Take 1 tablet (25 mg) three times daily as needed: For anxiety   Indication:  Tension, Anxiety     metFORMIN 500 MG tablet  Commonly known as:  GLUCOPHAGE  Take 1 tablet (500 mg total) by mouth daily with breakfast. For diabetes management   Indication:  Type 2 Diabetes     QUEtiapine 300 MG tablet  Commonly known as:  SEROQUEL  Take 1 tablet (300 mg total) by mouth at bedtime. For mood control   Indication:  Mood control     traZODone 100 MG tablet  Commonly known as:  DESYREL  Take 1 tablet (100 mg total) by mouth at bedtime as needed for sleep.   Indication:  Trouble Sleeping     venlafaxine XR 150 MG 24 hr capsule  Commonly known as:  EFFEXOR-XR  Take 2 capsules (300 mg total) by mouth every morning. For depression   Indication:  Major Depressive Disorder       Follow-up Information   Follow up with [facility] On 10/14/2013. (at 5:00pm for medication management with Dr. Rosine Door. Please arrive shortly before 5:00pm; Please call the number listed above to schedule your therapy appointment. )    Contact information:   [address] [place], [place] [address] [phone]     Follow-up recommendations: Activity:  As tolerated Diet: As recommended by your primary care doctor. Keep all scheduled follow-up appointments as recommended.   Comments:  Take all your medications as prescribed by your mental healthcare provider. Report any adverse effects and or reactions from your medicines to your outpatient provider promptly. Patient is instructed and cautioned to not engage in alcohol and or illegal drug use while on prescription medicines. In the event of worsening symptoms, patient is instructed to call the crisis  hotline, 911 and or go to the nearest ED for appropriate evaluation and treatment of symptoms. Follow-up with your primary care provider for your other medical issues, concerns and or health care needs.    Total Discharge Time:  Greater than 30 minutes.  Signed: Encarnacion Slates, PMHNP-BC 09/23/2013, 4:27 PM  Patient seen, evaluated and I agree with notes by Nurse Practitioner. Corena Pilgrim, MD

## 2013-09-23 NOTE — [facility] Suicide Risk Assessment (Signed)
Demographic Factors:  Low socioeconomic status and Unemployed  Total Time spent with patient: 20 minutes  Psychiatric Specialty Exam: Physical Exam  Constitutional: She is oriented to person, place, and time. She appears well-developed and well-nourished.  HENT:  Head: Normocephalic and atraumatic.  Neck: Normal range of motion.  Cardiovascular: Normal rate.   Respiratory: Effort normal.  GI: Soft.  Musculoskeletal: Normal range of motion.  Neurological: She is alert and oriented to person, place, and time.  Skin: Skin is warm and dry.  Psychiatric: She has a normal mood and affect. Her behavior is normal. Thought content normal.    Review of Systems  Constitutional: Negative.   HENT: Negative.   Eyes: Negative.   Respiratory: Negative.   Cardiovascular: Negative.   Gastrointestinal: Negative.   Genitourinary: Negative.   Musculoskeletal: Negative.   Skin: Negative.   Neurological: Negative.   Psychiatric/Behavioral: Negative.     Blood pressure 135/85, pulse 78, temperature 98.7 F (37.1 C), temperature source Oral, resp. rate 18, height 5\' 3"  (1.6 m), weight 61.236 kg (135 lb), SpO2 98.00%.Body mass index is 23.92 kg/(m^2).  General Appearance: Casual  Eye Contact::  Good  Speech:  Clear and Coherent  Volume:  Normal  Mood:  Euthymic  Affect:  Congruent  Thought Process:  Goal Directed  Orientation:  Full (Time, Place, and Person)  Thought Content:  denies AH/VH/Paranoia  Suicidal Thoughts:  No  Homicidal Thoughts:  No  Memory:  Immediate;   Fair Recent;   Fair Remote;   Fair  Judgement:  Fair  Insight:  Fair  Psychomotor Activity:  Normal  Concentration:  Fair  Recall:  [employer] of Knowledge:Good  Language: Fair  Akathisia:  No    AIMS (if indicated):   0  Assets:  Communication Skills Desire for Improvement Housing Intimacy Social Support  Sleep:  Number of Hours: 5.75    Musculoskeletal: Strength & Muscle Tone: within normal limits Gait &  Station: normal Patient leans: N/A   Mental Status Per Nursing Assessment::   On Admission:   NA  Current Mental Status by Physician: denies AH/VH/HI/SI. Reports mood as good and reports setting goals for herself to stay away from ETOH as well as to find a job or hobby to keep self occupied.  Loss Factors: Financial problems/change in socioeconomic status  Historical Factors: Impulsivity  Risk Reduction Factors:   Sense of responsibility to family, Religious beliefs about death, Positive social support, Positive therapeutic relationship and Positive coping skills or problem solving skills  Continued Clinical Symptoms:  Alcohol/Substance Abuse/Dependencies More than one psychiatric diagnosis  Cognitive Features That Contribute To Risk:  Polarized thinking    Suicide Risk:  Minimal: No identifiable suicidal ideation.  Patients presenting with no risk factors but with morbid ruminations; may be classified as minimal risk based on the severity of the depressive symptoms  Discharge Diagnoses:   Primary psychiatric diagnosis:  Generalized anxiety disorder   Secondary psychiatric diagnosis:  Alcohol use disorder  Alcohol withdrawal (resolved) Tobacco use disorder   Non psychiatric diagnosis:  Diabetes Mellitus  Breast ca, s/p mastectomy (left    Past Medical History  Diagnosis Date  . Diabetes mellitus without complication   . Cancer     breast    Plan Of Care/Follow-up recommendations:  Activity:  No restrictions,follow up with outpatient care  Is patient on multiple antipsychotic therapies at discharge:  No   Has Patient had three or more failed trials of antipsychotic monotherapy by history:  No  Recommended Plan for Multiple Antipsychotic Therapies: NA    Sheryl Landry 09/23/2013, 10:07 AM

## 2013-09-23 NOTE — Progress Notes (Signed)
Pt needing frequent support and redirect this evening. Came to med window and said, "I'm shaking, see?" Pt held out her hands and stated she needed ativan. When given only available prn, vistaril, pt was able to steadily hold cup without difficulty. A short time later pt became verbally aggressive in the dayroom threatening to harm a female peer. Cursing, agitated and posturing. Staff intervened, attempted verbal de-escalation and pt was medicated with hs meds as well as zydis prn. Pt did calm down and has been able to rest. She did endorse AH however does not appear to be experiencing internal stimuli. No SI/HI/VH and pt contracted for safety and assured this RN she would not become aggressive. Jamie Kato

## 2013-09-23 NOTE — Progress Notes (Signed)
[facility] Adult Case Management Discharge Plan :  Will you be returning to the same living situation after discharge: Yes,  Pt will return home with husband At discharge, do you have transportation home?:Yes,  husband to provide transportation Do you have the ability to pay for your medications:Yes,  Pt has private insurance and was provided with a 30-day perscription  Release of information consent forms completed and in the chart;  Patient's signature needed at discharge.  Patient to Follow up at: Follow-up Information   Follow up with [facility] On 10/14/2013. (at 5:00pm for medication management with Dr. Rosine Door. Please arrive shortly before 5:00pm; Please call the number listed below to schedule your therapy appointment. )    Contact information:   [place], [place] [address] [phone]      Patient denies SI/HI:   Yes,  Pt denies    Safety Planning and Suicide Prevention discussed:  Yes,  with husband.  See suicide prevention education note for further details  Bo Mcclintock 09/23/2013, 10:43 AM

## 2013-09-23 NOTE — Plan of Care (Signed)
Problem: Ineffective individual coping Goal: STG: Pt will be able to identify effective and ineffective STG: Pt will be able to identify effective and ineffective coping patterns  Outcome: Not Progressing Patient continues to be extremely labile and unpredictable, frequently becoming agitated and verbally aggressive.  Problem: Alteration in mood & ability to function due to Goal: STG-Patient will comply with prescribed medication regimen (Patient will comply with prescribed medication regimen)  Outcome: Progressing Patient compliant with meds and requiring frequent prn meds.

## 2013-09-23 NOTE — Progress Notes (Signed)
Pt calm and cooperative. Pt alert and oriented. Pt ate and took her scheduled meds and tolerated them well.Pt reports being ready for discharge and will use skills learned here to help her once she leaves. Medications reviewed with patient and discharge instructions were highlighted and explained to Pt. Pt reports understanding and says she will follow up with her Dr. Abbott Pao continues to be cooperative and compliant with RN. Pt denies any SI/HI/AH/VH. Pt received belongings back from locker and husband was in lobby area to pick her up. No concerns or questions and Pt has discharged.

## 2013-09-26 NOTE — Progress Notes (Signed)
Patient Discharge Instructions:  After Visit Summary (AVS):   Faxed to:  09/26/13 Discharge Summary Note:   Faxed to:  09/26/13 Psychiatric Admission Assessment Note:   Faxed to:  09/26/13 Suicide Risk Assessment - Discharge Assessment:   Faxed to:  09/26/13 Faxed/Sent to the Next Level Care provider:  09/26/13 Faxed to [facility] [place], 09/26/2013, 2:35 PM

## 2013-10-16 NOTE — Progress Notes (Signed)
Case discussed with NP.  Chart reviewed. Agree with above assessment and plan.  Amman Bartel, MD 

## 2013-12-05 ENCOUNTER — Emergency Department (HOSPITAL_COMMUNITY): Payer: 59

## 2013-12-05 ENCOUNTER — Telehealth (INDEPENDENT_AMBULATORY_CARE_PROVIDER_SITE_OTHER): Payer: Self-pay

## 2013-12-05 ENCOUNTER — Telehealth (INDEPENDENT_AMBULATORY_CARE_PROVIDER_SITE_OTHER): Payer: Self-pay | Admitting: General Surgery

## 2013-12-05 ENCOUNTER — Encounter (HOSPITAL_COMMUNITY): Payer: Self-pay | Admitting: Emergency Medicine

## 2013-12-05 ENCOUNTER — Emergency Department (HOSPITAL_COMMUNITY)
Admission: EM | Admit: 2013-12-05 | Discharge: 2013-12-05 | Disposition: A | Discharge status: Home / Self Care | Payer: 59 | Attending: Emergency Medicine | Admitting: Emergency Medicine

## 2013-12-05 DIAGNOSIS — E119 Type 2 diabetes mellitus without complications: Secondary | ICD-10-CM | POA: Insufficient documentation

## 2013-12-05 DIAGNOSIS — K5900 Constipation, unspecified: Secondary | ICD-10-CM | POA: Insufficient documentation

## 2013-12-05 DIAGNOSIS — R52 Pain, unspecified: Secondary | ICD-10-CM

## 2013-12-05 DIAGNOSIS — R109 Unspecified abdominal pain: Secondary | ICD-10-CM | POA: Diagnosis not present

## 2013-12-05 DIAGNOSIS — Z79899 Other long term (current) drug therapy: Secondary | ICD-10-CM | POA: Diagnosis not present

## 2013-12-05 DIAGNOSIS — Z853 Personal history of malignant neoplasm of breast: Secondary | ICD-10-CM | POA: Insufficient documentation

## 2013-12-05 LAB — URINALYSIS, ROUTINE W REFLEX MICROSCOPIC
Bilirubin Urine: NEGATIVE
Glucose, UA: NEGATIVE mg/dL
Hgb urine dipstick: NEGATIVE
Ketones, ur: NEGATIVE mg/dL
Leukocytes, UA: NEGATIVE
Nitrite: NEGATIVE
Protein, ur: NEGATIVE mg/dL
Specific Gravity, Urine: 1.027 (ref 1.005–1.030)
Urobilinogen, UA: 0.2 mg/dL (ref 0.0–1.0)
pH: 6 (ref 5.0–8.0)

## 2013-12-05 LAB — COMPREHENSIVE METABOLIC PANEL
ALT: 33 U/L (ref 0–35)
AST: 35 U/L (ref 0–37)
Albumin: 3.7 g/dL (ref 3.5–5.2)
Alkaline Phosphatase: 93 U/L (ref 39–117)
Anion gap: 11 (ref 5–15)
BUN: 13 mg/dL (ref 6–23)
CO2: 25 mEq/L (ref 19–32)
Calcium: 9 mg/dL (ref 8.4–10.5)
Chloride: 103 mEq/L (ref 96–112)
Creatinine, Ser: 0.73 mg/dL (ref 0.50–1.10)
GFR calc Af Amer: >90 mL/min (ref >90)
GFR calc non Af Amer: >90 mL/min (ref >90)
Glucose, Bld: 118 mg/dL — ABNORMAL HIGH (ref 70–99)
Potassium: 4 mEq/L (ref 3.7–5.3)
Sodium: 139 mEq/L (ref 137–147)
Total Bilirubin: <0.2 mg/dL — ABNORMAL LOW (ref 0.3–1.2)
Total Protein: 7.6 g/dL (ref 6.0–8.3)

## 2013-12-05 LAB — CBC WITH DIFFERENTIAL/PLATELET
Basophils Absolute: 0 10*3/uL (ref 0.0–0.1)
Basophils Relative: 0 % (ref 0–1)
Eosinophils Absolute: 0.1 10*3/uL (ref 0.0–0.7)
Eosinophils Relative: 1 % (ref 0–5)
HCT: 33.8 % — ABNORMAL LOW (ref 36.0–46.0)
Hemoglobin: 10.8 g/dL — ABNORMAL LOW (ref 12.0–15.0)
Lymphocytes Relative: 46 % (ref 12–46)
Lymphs Abs: 2.8 10*3/uL (ref 0.7–4.0)
MCH: 27.6 pg (ref 26.0–34.0)
MCHC: 32 g/dL (ref 30.0–36.0)
MCV: 86.4 fL (ref 78.0–100.0)
Monocytes Absolute: 0.8 10*3/uL (ref 0.1–1.0)
Monocytes Relative: 13 % — ABNORMAL HIGH (ref 3–12)
Neutro Abs: 2.4 10*3/uL (ref 1.7–7.7)
Neutrophils Relative %: 40 % — ABNORMAL LOW (ref 43–77)
Platelets: 316 10*3/uL (ref 150–400)
RBC: 3.91 MIL/uL (ref 3.87–5.11)
RDW: 14 % (ref 11.5–15.5)
WBC: 6 10*3/uL (ref 4.0–10.5)

## 2013-12-05 LAB — LIPASE, BLOOD: Lipase: 36 U/L (ref 11–59)

## 2013-12-05 MED ORDER — FENTANYL CITRATE 0.05 MG/ML IJ SOLN
50.0000 ug | Freq: Once | INTRAMUSCULAR | Status: AC
  Administered 2013-12-05: 50 ug via NASAL
  Filled 2013-12-05: qty 2

## 2013-12-05 MED ORDER — DICYCLOMINE HCL 20 MG PO TABS: 20.0000 mg | ORAL_TABLET | Freq: Two times a day (BID) | ORAL | Status: DC | PRN

## 2013-12-05 MED ORDER — IOHEXOL 300 MG/ML  SOLN
50.0000 mL | Freq: Once | INTRAMUSCULAR | Status: AC | PRN
  Administered 2013-12-05: 50 mL via ORAL

## 2013-12-05 MED ORDER — MORPHINE SULFATE 4 MG/ML IJ SOLN
4.0000 mg | Freq: Once | INTRAMUSCULAR | Status: AC
  Administered 2013-12-05: 4 mg via INTRAVENOUS
  Filled 2013-12-05: qty 1

## 2013-12-05 MED ORDER — POLYETHYLENE GLYCOL 3350 17 G PO PACK: 17.0000 g | PACK | Freq: Every day | ORAL | Status: DC

## 2013-12-05 MED ORDER — IOHEXOL 300 MG/ML  SOLN
100.0000 mL | Freq: Once | INTRAMUSCULAR | Status: AC | PRN
  Administered 2013-12-05: 100 mL via INTRAVENOUS

## 2013-12-05 NOTE — Telephone Encounter (Signed)
Call the patient and discussed results of her ER evaluation.  Her abdominal pain is improving and is similar to what she has had in the past.  Appears to be constipation by x-ray.  She will continue MiraLAX and call if the pain does not continue to improve.

## 2013-12-05 NOTE — ED Notes (Signed)
Pt c/o abd pain onset yesterday, worsening in the evening. Pt denies n/v/d. Denies alcohol use.

## 2013-12-05 NOTE — Discharge Instructions (Signed)
Abdominal Pain, Women °Abdominal (stomach, pelvic, or belly) pain can be caused by many things. It is important to tell your doctor: °· The location of the pain. °· Does it come and go or is it present all the time? °· Are there things that start the pain (eating certain foods, exercise)? °· Are there other symptoms associated with the pain (fever, nausea, vomiting, diarrhea)? °All of this is helpful to know when trying to find the cause of the pain. °CAUSES  °· Stomach: virus or bacteria infection, or ulcer. °· Intestine: appendicitis (inflamed appendix), regional ileitis (Crohn's disease), ulcerative colitis (inflamed colon), irritable bowel syndrome, diverticulitis (inflamed diverticulum of the colon), or cancer of the stomach or intestine. °· Gallbladder disease or stones in the gallbladder. °· Kidney disease, kidney stones, or infection. °· Pancreas infection or cancer. °· Fibromyalgia (pain disorder). °· Diseases of the female organs: °¨ Uterus: fibroid (non-cancerous) tumors or infection. °¨ Fallopian tubes: infection or tubal pregnancy. °¨ Ovary: cysts or tumors. °¨ Pelvic adhesions (scar tissue). °¨ Endometriosis (uterus lining tissue growing in the pelvis and on the pelvic organs). °¨ Pelvic congestion syndrome (female organs filling up with blood just before the menstrual period). °¨ Pain with the menstrual period. °¨ Pain with ovulation (producing an egg). °¨ Pain with an IUD (intrauterine device, birth control) in the uterus. °¨ Cancer of the female organs. °· Functional pain (pain not caused by a disease, may improve without treatment). °· Psychological pain. °· Depression. °DIAGNOSIS  °Your doctor will decide the seriousness of your pain by doing an examination. °· Blood tests. °· X-rays. °· Ultrasound. °· CT scan (computed tomography, special type of X-ray). °· MRI (magnetic resonance imaging). °· Cultures, for infection. °· Barium enema (dye inserted in the large intestine, to better view it with  X-rays). °· Colonoscopy (looking in intestine with a lighted tube). °· Laparoscopy (minor surgery, looking in abdomen with a lighted tube). °· Major abdominal exploratory surgery (looking in abdomen with a large incision). °TREATMENT  °The treatment will depend on the cause of the pain.  °· Many cases can be observed and treated at home. °· Over-the-counter medicines recommended by your caregiver. °· Prescription medicine. °· Antibiotics, for infection. °· Birth control pills, for painful periods or for ovulation pain. °· Hormone treatment, for endometriosis. °· Nerve blocking injections. °· Physical therapy. °· Antidepressants. °· Counseling with a psychologist or psychiatrist. °· Minor or major surgery. °HOME CARE INSTRUCTIONS  °· Do not take laxatives, unless directed by your caregiver. °· Take over-the-counter pain medicine only if ordered by your caregiver. Do not take aspirin because it can cause an upset stomach or bleeding. °· Try a clear liquid diet (broth or water) as ordered by your caregiver. Slowly move to a bland diet, as tolerated, if the pain is related to the stomach or intestine. °· Have a thermometer and take your temperature several times a day, and record it. °· Bed rest and sleep, if it helps the pain. °· Avoid sexual intercourse, if it causes pain. °· Avoid stressful situations. °· Keep your follow-up appointments and tests, as your caregiver orders. °· If the pain does not go away with medicine or surgery, you may try: °¨ Acupuncture. °¨ Relaxation exercises (yoga, meditation). °¨ Group therapy. °¨ Counseling. °SEEK MEDICAL CARE IF:  °· You notice certain foods cause stomach pain. °· Your home care treatment is not helping your pain. °· You need stronger pain medicine. °· You want your IUD removed. °· You feel faint or   lightheaded. °· You develop nausea and vomiting. °· You develop a rash. °· You are having side effects or an allergy to your medicine. °SEEK IMMEDIATE MEDICAL CARE IF:  °· Your  pain does not go away or gets worse. °· You have a fever. °· Your pain is felt only in portions of the abdomen. The right side could possibly be appendicitis. The left lower portion of the abdomen could be colitis or diverticulitis. °· You are passing blood in your stools (bright red or black tarry stools, with or without vomiting). °· You have blood in your urine. °· You develop chills, with or without a fever. °· You pass out. °MAKE SURE YOU:  °· Understand these instructions. °· Will watch your condition. °· Will get help right away if you are not doing well or get worse. °Document Released: 11/14/2006 Document Revised: 06/03/2013 Document Reviewed: 12/04/2008 °ExitCare® Patient Information ©2015 [employer]. This information is not intended to replace advice given to you by your health care provider. Make sure you discuss any questions you have with your health care provider. ° °Constipation °Constipation is when a person has fewer than three bowel movements a week, has difficulty having a bowel movement, or has stools that are dry, hard, or larger than normal. As people grow older, constipation is more common. If you try to fix constipation with medicines that make you have a bowel movement (laxatives), the problem may get worse. Long-term laxative use may cause the muscles of the colon to become weak. A low-fiber diet, not taking in enough fluids, and taking certain medicines may make constipation worse.  °CAUSES  °· Certain medicines, such as antidepressants, pain medicine, iron supplements, antacids, and water pills.   °· Certain diseases, such as diabetes, irritable bowel syndrome (IBS), thyroid disease, or depression.   °· Not drinking enough water.   °· Not eating enough fiber-rich foods.   °· Stress or travel.   °· Lack of physical activity or exercise.   °· Ignoring the urge to have a bowel movement.   °· Using laxatives too much.   °SIGNS AND SYMPTOMS  °· Having fewer than three bowel movements a  week.   °· Straining to have a bowel movement.   °· Having stools that are hard, dry, or larger than normal.   °· Feeling full or bloated.   °· Pain in the lower abdomen.   °· Not feeling relief after having a bowel movement.   °DIAGNOSIS  °Your health care provider will take a medical history and perform a physical exam. Further testing may be done for severe constipation. Some tests may include: °· A barium enema X-ray to examine your rectum, colon, and, sometimes, your small intestine.   °· A sigmoidoscopy to examine your lower colon.   °· A colonoscopy to examine your entire colon. °TREATMENT  °Treatment will depend on the severity of your constipation and what is causing it. Some dietary treatments include drinking more fluids and eating more fiber-rich foods. Lifestyle treatments may include regular exercise. If these diet and lifestyle recommendations do not help, your health care provider may recommend taking over-the-counter laxative medicines to help you have bowel movements. Prescription medicines may be prescribed if over-the-counter medicines do not work.  °HOME CARE INSTRUCTIONS  °· Eat foods that have a lot of fiber, such as fruits, vegetables, whole grains, and beans. °· Limit foods high in fat and processed sugars, such as french fries, hamburgers, cookies, candies, and soda.   °· A fiber supplement may be added to your diet if you cannot get enough fiber from foods.   °·   Drink enough fluids to keep your urine clear or pale yellow.   °· Exercise regularly or as directed by your health care provider.   °· Go to the restroom when you have the urge to go. Do not hold it.   °· Only take over-the-counter or prescription medicines as directed by your health care provider. Do not take other medicines for constipation without talking to your health care provider first.   °SEEK IMMEDIATE MEDICAL CARE IF:  °· You have bright red blood in your stool.   °· Your constipation lasts for more than 4 days or gets  worse.   °· You have abdominal or rectal pain.   °· You have thin, pencil-like stools.   °· You have unexplained weight loss. °MAKE SURE YOU:  °· Understand these instructions. °· Will watch your condition. °· Will get help right away if you are not doing well or get worse. °Document Released: 10/16/2003 Document Revised: 01/22/2013 Document Reviewed: 10/29/2012 °ExitCare® Patient Information ©2015 [employer]. This information is not intended to replace advice given to you by your health care provider. Make sure you discuss any questions you have with your health care provider. ° °

## 2013-12-05 NOTE — ED Notes (Signed)
MD at bedside. 

## 2013-12-05 NOTE — ED Provider Notes (Signed)
CSN: [id number]     Arrival date & time 12/05/13  0047 History   First MD Initiated Contact with Patient 12/05/13 0216     Chief Complaint  Patient presents with  . Abdominal Pain     (Consider location/radiation/quality/duration/timing/severity/associated sxs/prior Treatment) HPI Patient presents with one day of gradually increasing generalized stabbing abdominal pain. No nausea or vomiting. Last bowel movement was yesterday. Passing gas. No fever or chills. 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 She denies any urinary symptoms. Past Medical History  Diagnosis Date  . Diabetes mellitus without complication   . Cancer     breast   Past Surgical History  Procedure Laterality Date  . Gastric bypass    . Abdominal hysterectomy    . Mastectomy Left    Family History  Problem Relation Age of Onset  . Diabetes type II Mother    History  Substance Use Topics  . Smoking status: Current Every Day Smoker    Types: Cigarettes  . Smokeless tobacco: Never Used  . Alcohol Use: Yes   OB History    No data available     Review of Systems  Constitutional: Negative for fever and chills.  Respiratory: Negative for cough and shortness of breath.   Cardiovascular: Negative for chest pain and palpitations.  Gastrointestinal: Positive for abdominal pain. Negative for nausea, vomiting, diarrhea, constipation and blood in stool.  Genitourinary: Negative for dysuria, frequency, hematuria and flank pain.  Musculoskeletal: Negative for myalgias, back pain, neck pain and neck stiffness.  Neurological: Negative for dizziness, weakness, light-headedness, numbness and headaches.  All other systems reviewed and are negative.     Allergies  Review of patient's allergies indicates no known allergies.  Home Medications   Prior to Admission medications   Medication Sig Start Date End Date Taking? Authorizing Provider   gabapentin (NEURONTIN) 300 MG capsule Take 1 capsule (300 mg total) by mouth 3 (three) times daily. For substance withdrawal syndrome 09/23/13  Yes Encarnacion Slates, NP  hydrOXYzine (ATARAX/VISTARIL) 25 MG tablet Take 1 tablet (25 mg) three times daily as needed: For anxiety 09/23/13  Yes Encarnacion Slates, NP  ibuprofen (ADVIL,MOTRIN) 200 MG tablet Take 400 mg by mouth every 6 (six) hours as needed for moderate pain.   Yes Historical Provider, MD  metFORMIN (GLUCOPHAGE) 500 MG tablet Take 1 tablet (500 mg total) by mouth daily with breakfast. For diabetes management 09/23/13  Yes Encarnacion Slates, NP  Multiple Vitamin (MULTIVITAMIN WITH MINERALS) TABS tablet Take 1 tablet by mouth daily.   Yes Historical Provider, MD  QUEtiapine (SEROQUEL) 300 MG tablet Take 1 tablet (300 mg total) by mouth at bedtime. For mood control 09/23/13  Yes Encarnacion Slates, NP  traZODone (DESYREL) 100 MG tablet Take 1 tablet (100 mg total) by mouth at bedtime as needed for sleep. 09/23/13  Yes Encarnacion Slates, NP  venlafaxine XR (EFFEXOR-XR) 150 MG 24 hr capsule Take 2 capsules (300 mg total) by mouth every morning. For depression 09/23/13  Yes Encarnacion Slates, NP  docusate sodium 100 MG CAPS Take 100 mg by mouth 2 (two) times daily. (May purchase from over the counter at your local pharmacy): For constipation 09/23/13   Encarnacion Slates, NP   BP 172/96 mmHg  Pulse 74  Temp(Src) 97.7 F (36.5 C) (Oral)  Resp 22  Ht 5\' 3"  (1.6 m)  Wt 150 lb (68.04 kg)  BMI 26.58 kg/m2  SpO2 100% Physical Exam  Constitutional: She is oriented to person, place, and time. She appears well-developed and well-nourished. No distress.  HENT:  Head: Normocephalic and atraumatic.  Mouth/Throat: Oropharynx is clear and moist.  Eyes: EOM are normal. Pupils are equal, round, and reactive to light.  Neck: Normal range of motion. Neck supple.  Cardiovascular: Normal rate and regular rhythm.   Pulmonary/Chest: Effort normal and breath sounds normal. No respiratory  distress. She has no wheezes. She has no rales. She exhibits no tenderness.  Abdominal: Soft. She exhibits no distension and no mass. There is tenderness (mild diffuse tenderness throughout. No focality.). There is no rebound and no guarding.  Decreased bowel sounds throughout  Musculoskeletal: Normal range of motion. She exhibits no edema or tenderness.  Neurological: She is alert and oriented to person, place, and time.  Skin: Skin is warm and dry. No rash noted. No erythema.  Psychiatric: She has a normal mood and affect. Her behavior is normal.  Nursing note and vitals reviewed.   ED Course  Procedures (including critical care time) Labs Review Labs Reviewed  CBC WITH DIFFERENTIAL - Abnormal; Notable for the following:    Hemoglobin 10.8 (*)    HCT 33.8 (*)    Neutrophils Relative % 40 (*)    Monocytes Relative 13 (*)    All other components within normal limits  COMPREHENSIVE METABOLIC PANEL - Abnormal; Notable for the following:    Glucose, Bld 118 (*)    Total Bilirubin <0.2 (*)    All other components within normal limits  LIPASE, BLOOD  URINALYSIS, ROUTINE W REFLEX MICROSCOPIC    Imaging Review Dg Abd Acute W/chest  12/05/2013   CLINICAL DATA:  Abdominal pain for 24 hr, now worsening. Chest pain radiating to lower abdomen.  EXAM: ACUTE ABDOMEN SERIES (ABDOMEN 2 VIEW & CHEST 1 VIEW)  COMPARISON:  CT of the abdomen pelvis January 31, 2010  FINDINGS: The cardiomediastinal silhouette is unremarkable. No pleural effusions or focal consolidations. Trachea projects midline and there is no pneumothorax. Moderate degenerate change of the shoulders. Surgical clips in LEFT axilla. A LEFT breast implant.  Moderate to large amount of retained large bowel stool. Bowel gas pattern is nondilated and nonobstructive. No intra-abdominal mass effect. Cyst bowel anastomotic sutures in LEFT upper abdomen corresponding to gastric bypass surgery. Surgical clips in the included right abdomen likely  reflect cholecystectomy. Soft tissue planes and included osseous structures are nonsuspicious.  IMPRESSION: No acute cardiopulmonary process.  Moderate to large amount of retained large bowel stool with nonobstructive bowel gas pattern.   Electronically Signed   By: Elon Alas   On: 12/05/2013 01:42     EKG Interpretation None      MDM   Final diagnoses:  Abdominal pain     Patient's abdomen remains soft. Laboratory is all normal limits. Both plain x-ray and CT consistent with large amount of retained bowel stool.will start MiraLAX. Patient been given return precautions and has voiced understanding.  Julianne Rice, MD 12/05/13 [id number]

## 2013-12-05 NOTE — Telephone Encounter (Signed)
Called pt back after Dr Zella Richer looked at phone notes and the CT scan from the ER visit today. Per Dr Zella Richer he will not give pt any pain medication b/c she is constipated per the CT scan. Pt advised that she needs to work on getting her self cleaned out with Miralax or suppositories. Pt advised that pain medicine would not help her situation but could make it worse per Dr Zella Richer. Pt advised that she should drink plenty of liquids and try to eliminate solid foods until she gets herself cleaned out. Pt understands and is going to work on having a BM. Pt's last BM was this past Tuesday. I advised pt that if the abdominal pain gets worse then she may have to go back to the ER. Per Dr Zella Richer he agrees with this plan.

## 2013-12-05 NOTE — Telephone Encounter (Signed)
Pt calling back. Pt states she spoke with Dr Excell Seltzer today re: her abd pain and constipation. Pt states she is still having stabbing abd pain. No vomiting. Pt states ER would not give her any pain med today. She is requesting roxicet. I advised pt that Dr Excell Seltzer is not available this afternoon and if her abd pain is increasing she may need to return to the ER. Pt declined. Pt requests we ask the on call MD. Pt advised the request will be taken to our on call MD but it may be tomorrow before rx request can be handled. Again pt encouraged if abd pain is increasing she needs to return to ER. Pt again declined ER. Request will be given to Dr Zella Richer to review.

## 2013-12-05 NOTE — ED Notes (Signed)
Patient transported to CT 

## 2013-12-05 NOTE — ED Notes (Addendum)
Pt had gastric bypass surgery a few years ago with hx of intestinal herniation.  Pain in abdominal area feels like the stabbing/sharp pain she felt at that time.

## 2013-12-05 NOTE — ED Notes (Signed)
Patient returned from CT

## 2014-09-23 ENCOUNTER — Encounter: Payer: Self-pay | Admitting: Neurology

## 2014-09-23 ENCOUNTER — Ambulatory Visit (INDEPENDENT_AMBULATORY_CARE_PROVIDER_SITE_OTHER): Payer: Commercial Managed Care - HMO | Admitting: Neurology

## 2014-09-23 VITALS — BP 131/91 | HR 80 | Ht 63.0 in | Wt 169.0 lb

## 2014-09-23 DIAGNOSIS — Z853 Personal history of malignant neoplasm of breast: Secondary | ICD-10-CM | POA: Diagnosis not present

## 2014-09-23 DIAGNOSIS — H4922 Sixth [abducent] nerve palsy, left eye: Secondary | ICD-10-CM | POA: Diagnosis not present

## 2014-09-23 DIAGNOSIS — G44209 Tension-type headache, unspecified, not intractable: Secondary | ICD-10-CM | POA: Diagnosis not present

## 2014-09-23 NOTE — Progress Notes (Signed)
PATIENT: Sheryl Landry DOB: Oct 19, 1960  Chief Complaint  Patient presents with  . 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  She had an abnormal eye exam that prompted her to need a neurological evaluation.     HISTORICAL  Sheryl Landry is a 54 years old right-handed female, seen in refer by her ophthalmologist Dr. Delman Cheadle  for evaluation of worsening left-sided blurry vision, reported abnormal eye examination  She has past medical history of diabetes, history of left breast cancer, status post lobectomy in 1999, followed by chemotherapy, also had a history of gastric bypass lost 175 pounds  Since July 2016, she noticed gradual onset left-sided blurry vision, multiple visions involving both eyes, difficulty abducting her left eye, she also noticed frequent tearing, worsening left-sided headaches,   She was evaluated by Dr. Delman Cheadle in August 5th 2016, visual acuity, OD 20/30, OS 20/60 evidence of left sixth nerve palsy,  She reported excessive stress, her sister is at ICU  REVIEW OF SYSTEMS: Full 14 system review of systems performed and notable only for headache, dizziness, blurry vision, double vision, eye pain,  ALLERGIES: No Known Allergies  HOME MEDICATIONS: Current Outpatient Prescriptions  Medication Sig Dispense Refill  . amitriptyline (ELAVIL) 25 MG tablet     . hydrOXYzine (ATARAX/VISTARIL) 25 MG tablet Take 1 tablet (25 mg) three times daily as needed: For anxiety 45 tablet 0  . metFORMIN (GLUCOPHAGE) 500 MG tablet Take 1 tablet (500 mg total) by mouth daily with breakfast. For diabetes management    . Multiple Vitamin (MULTIVITAMIN WITH MINERALS) TABS tablet Take 1 tablet by mouth daily.    . QUEtiapine (SEROQUEL) 300 MG tablet Take 1 tablet (300 mg total) by mouth at bedtime. For mood control 30 tablet 0  . venlafaxine XR (EFFEXOR-XR) 150 MG 24 hr capsule Take 2 capsules (300 mg total) by mouth  every morning. For depression 60 capsule 0     PAST MEDICAL HISTORY: Past Medical History  Diagnosis Date  . Diabetes mellitus without complication   . Cancer     breast  . Dysplasia of eye     PAST SURGICAL HISTORY: Past Surgical History  Procedure Laterality Date  . Gastric bypass    . Abdominal hysterectomy    . Mastectomy Left     FAMILY HISTORY: Family History  Problem Relation Age of Onset  . Diabetes type II Mother   . Hypertension Mother   . Stroke Mother   . Diabetes Father   . Heart disease Father   . Dementia Father     SOCIAL HISTORY:  Social History   Social History  . Marital Status: Married    Spouse Name: N/A  . Number of Children: 3  . Years of Education: Some coll   Occupational History  . Unemployed    Social History Main Topics  . Smoking status: Current Every Day Smoker    Types: Cigarettes  . Smokeless tobacco: Never Used     Comment: 2-3 cigarettes per day.  . Alcohol Use: No  . Drug Use: No  . Sexual Activity: Not on file   Other Topics Concern  . Not on file   Social History Narrative   Lives at home with her husband.   Right-handed.   4 cups caffeine per day.     PHYSICAL EXAM   Filed Vitals:   09/23/14 1112  BP:  131/91  Pulse: 80  Height: 5\' 3"  (1.6 m)  Weight: 169 lb (76.658 kg)    Not recorded      Body mass index is 29.94 kg/(m^2).  PHYSICAL EXAMNIATION:  Gen: NAD, conversant, well nourised, obese, well groomed                     Cardiovascular: Regular rate rhythm, no peripheral edema, warm, nontender. Eyes: Conjunctivae clear without exudates or hemorrhage Neck: Supple, no carotid bruise. Pulmonary: Clear to auscultation bilaterally   NEUROLOGICAL EXAM:  MENTAL STATUS: Speech:    Speech is normal; fluent and spontaneous with normal comprehension.  Cognition:     Orientation to time, place and person     Normal recent and remote memory     Normal Attention span and concentration     Normal  Language, naming, repeating,spontaneous speech     Fund of knowledge   CRANIAL NERVES: CN II: Visual fields are full to confrontation. Fundoscopic exam is normal with sharp discs, mild left disc pale, Pupils are round equal and briskly reactive to light. CN III, IV, VI: She has left eye abductor weakness CN V: Facial sensation is intact to pinprick in all 3 divisions bilaterally. Corneal responses are intact.  CN VII: Face is symmetric with normal eye closure and smile. CN VIII: Hearing is normal to rubbing fingers CN IX, X: Palate elevates symmetrically. Phonation is normal. CN XI: Head turning and shoulder shrug are intact CN XII: Tongue is midline with normal movements and no atrophy.  MOTOR: There is no pronator drift of out-stretched arms. Muscle bulk and tone are normal. Muscle strength is normal.  REFLEXES: Reflexes are 2+ and symmetric at the biceps, triceps, knees, and ankles. Plantar responses are flexor.  SENSORY: Intact to light touch, pinprick, position sense, and vibration sense are intact in fingers and toes.  COORDINATION: Rapid alternating movements and fine finger movements are intact. There is no dysmetria on finger-to-nose and heel-knee-shin.    GAIT/STANCE: Posture is normal. Gait is steady with normal steps, base, arm swing, and turning. Heel and toe walking are normal. Tandem gait is normal.  Romberg is absent.   DIAGNOSTIC DATA (LABS, IMAGING, TESTING) - I reviewed patient records, labs, notes, testing and imaging myself where available.   ASSESSMENT AND PLAN  Sheryl Landry is a 54 y.o. 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  Ph.D.  [facility] [address] [place], [place] [address] Ph: [phone] Fax: [phone]  CC: Dr. Theda Sers, Dr. Lona Kettle

## 2014-09-24 LAB — COMPREHENSIVE METABOLIC PANEL
ALT: 22 IU/L (ref 0–32)
AST: 22 IU/L (ref 0–40)
Albumin/Globulin Ratio: 1.5 (ref 1.1–2.5)
Albumin: 4.2 g/dL (ref 3.5–5.5)
Alkaline Phosphatase: 102 IU/L (ref 39–117)
BUN/Creatinine Ratio: 15 (ref 9–23)
BUN: 10 mg/dL (ref 6–24)
Bilirubin Total: <0.2 mg/dL (ref 0.0–1.2)
CO2: 23 mmol/L (ref 18–29)
Calcium: 8.6 mg/dL — ABNORMAL LOW (ref 8.7–10.2)
Chloride: 101 mmol/L (ref 97–108)
Creatinine, Ser: 0.66 mg/dL (ref 0.57–1.00)
GFR calc Af Amer: 117 mL/min/{1.73_m2} (ref >59)
GFR calc non Af Amer: 102 mL/min/{1.73_m2} (ref >59)
Globulin, Total: 2.8 g/dL (ref 1.5–4.5)
Glucose: 74 mg/dL (ref 65–99)
Potassium: 4.7 mmol/L (ref 3.5–5.2)
Sodium: 141 mmol/L (ref 134–144)
Total Protein: 7 g/dL (ref 6.0–8.5)

## 2014-09-24 LAB — ANA W/REFLEX IF POSITIVE
Anti JO-1: <0.2 AI (ref 0.0–0.9)
Anti Nuclear Antibody(ANA): POSITIVE — AB
Centromere Ab Screen: <0.2 AI (ref 0.0–0.9)
Chromatin Ab SerPl-aCnc: <0.2 AI (ref 0.0–0.9)
ENA RNP Ab: 1.1 AI — ABNORMAL HIGH (ref 0.0–0.9)
ENA SM Ab Ser-aCnc: <0.2 AI (ref 0.0–0.9)
ENA SSA (RO) Ab: <0.2 AI (ref 0.0–0.9)
ENA SSB (LA) Ab: <0.2 AI (ref 0.0–0.9)
Scleroderma SCL-70: <0.2 AI (ref 0.0–0.9)
dsDNA Ab: <1 IU/mL (ref 0–9)

## 2014-09-24 LAB — THYROID PANEL WITH TSH
Free Thyroxine Index: 1.6 (ref 1.2–4.9)
T3 Uptake Ratio: 30 % (ref 24–39)
T4, Total: 5.4 ug/dL (ref 4.5–12.0)
TSH: 0.349 u[IU]/mL — ABNORMAL LOW (ref 0.450–4.500)

## 2014-09-24 LAB — VITAMIN B12: Vitamin B-12: 410 pg/mL (ref 211–946)

## 2014-09-24 LAB — CBC
Hematocrit: 33 % — ABNORMAL LOW (ref 34.0–46.6)
Hemoglobin: 10.4 g/dL — ABNORMAL LOW (ref 11.1–15.9)
MCH: 27.5 pg (ref 26.6–33.0)
MCHC: 31.5 g/dL (ref 31.5–35.7)
MCV: 87 fL (ref 79–97)
Platelets: 332 10*3/uL (ref 150–379)
RBC: 3.78 x10E6/uL (ref 3.77–5.28)
RDW: 14.8 % (ref 12.3–15.4)
WBC: 4.8 10*3/uL (ref 3.4–10.8)

## 2014-09-25 ENCOUNTER — Telehealth: Payer: Self-pay | Admitting: Neurology

## 2014-09-25 ENCOUNTER — Other Ambulatory Visit: Payer: Self-pay | Admitting: *Deleted

## 2014-09-25 ENCOUNTER — Encounter: Payer: Self-pay | Admitting: *Deleted

## 2014-09-25 DIAGNOSIS — F411 Generalized anxiety disorder: Secondary | ICD-10-CM

## 2014-09-25 MED ORDER — ALPRAZOLAM 1 MG PO TABS: ORAL_TABLET | ORAL | Status: DC

## 2014-09-25 NOTE — Telephone Encounter (Signed)
Patient is calling because she has an MRI scheduled for 10-05-14 and would like medication called in to calm patient down. Please call medication to [facility] on [address]. Thank you.

## 2014-09-25 NOTE — Telephone Encounter (Signed)
Please call patient, mildly low TSH, could indicate hyperthyroidism, I have faxed laboratory result to her primary care physician, may consider repeat laboratory evaluation next follow-up visit in September   CBC showed mild anemia, hemoglobin 10.4, is at her baseline, rest of the laboratory evaluation was normal

## 2014-09-25 NOTE — Telephone Encounter (Signed)
Spoke to Gillie - she is aware of lab results.

## 2014-09-25 NOTE — Telephone Encounter (Signed)
Rx faxed to [facility] on [address] - Fax [phone].

## 2014-09-25 NOTE — Telephone Encounter (Signed)
It is okay to give her Xanax sample, or call in  Xanax 1 mg 3 tablets, one tablet 30 minutes as needed before MRI, may repeat once if needed

## 2014-10-05 ENCOUNTER — Ambulatory Visit
Admission: RE | Admit: 2014-10-05 | Discharge: 2014-10-05 | Disposition: A | Discharge status: Home / Self Care | Payer: Self-pay | Source: Ambulatory Visit | Attending: Neurology | Admitting: Neurology

## 2014-10-05 ENCOUNTER — Other Ambulatory Visit: Payer: Self-pay | Admitting: Neurology

## 2014-10-05 DIAGNOSIS — H4922 Sixth [abducent] nerve palsy, left eye: Secondary | ICD-10-CM | POA: Diagnosis not present

## 2014-10-05 DIAGNOSIS — G44209 Tension-type headache, unspecified, not intractable: Secondary | ICD-10-CM

## 2014-10-05 DIAGNOSIS — Z853 Personal history of malignant neoplasm of breast: Secondary | ICD-10-CM

## 2014-10-07 ENCOUNTER — Telehealth: Payer: Self-pay | Admitting: *Deleted

## 2014-10-07 NOTE — Telephone Encounter (Signed)
-----   Message from Marcial Pacas, MD sent at 10/07/2014  7:47 AM EDT ----- Please call pt for normal MRI brain.

## 2014-10-07 NOTE — Telephone Encounter (Signed)
Spoke to patient - aware of normal results.

## 2014-10-16 ENCOUNTER — Ambulatory Visit (INDEPENDENT_AMBULATORY_CARE_PROVIDER_SITE_OTHER): Payer: Commercial Managed Care - HMO | Admitting: Neurology

## 2014-10-16 ENCOUNTER — Encounter: Payer: Self-pay | Admitting: Neurology

## 2014-10-16 VITALS — BP 128/88 | HR 68 | Ht 63.0 in | Wt 168.0 lb

## 2014-10-16 DIAGNOSIS — H532 Diplopia: Secondary | ICD-10-CM | POA: Diagnosis not present

## 2014-10-16 MED ORDER — ZOLPIDEM TARTRATE 5 MG PO TABS: 5.0000 mg | ORAL_TABLET | Freq: Every evening | ORAL | Status: DC | PRN

## 2014-10-16 MED ORDER — PYRIDOSTIGMINE BROMIDE 60 MG PO TABS: 60.0000 mg | ORAL_TABLET | Freq: Three times a day (TID) | ORAL | Status: DC

## 2014-10-16 NOTE — Progress Notes (Signed)
Chief Complaint  Patient presents with  . Paralysis of left sixth cranial nerve    She would like to discuss her labs, MRI and treatment plan.      PATIENT: Sheryl Landry DOB: [birth year]  Chief Complaint  Patient presents with  . Paralysis of left sixth cranial nerve    She would like to discuss her labs, MRI and treatment plan.     HISTORICAL  Sheryl Landry is a 54 years old right-handed female, seen in refer in September 23 2014 by her ophthalmologist Dr. Delman Cheadle  for evaluation of worsening left-sided blurry vision, reported abnormal eye examination  She has past medical history of diabetes, history of left breast cancer, status post lobectomy in 1999, followed by chemotherapy, also had a history of gastric bypass lost 175 pounds  Since July 2016, she noticed gradual onset left-sided blurry vision, multiple visions involving both eyes, difficulty abducting her left eye, she also noticed frequent tearing, worsening left-sided headaches,   She was evaluated by Dr. Delman Cheadle in August 5th 2016, visual acuity, OD 20/30, OS 20/60 evidence of left sixth nerve palsy,  She reported excessive stress, her sister is at ICU  UPDATE Oct 16 2014: She has lost her sister in September 27 2014, she continue have intermittent double vision, the relationship of her double visions changes, sometimes oblique, sometimes horizontal, today's examination also showed bilateral lateral rectus muscle weakness, which was different from last exam,  Laboratory evaluation, positive ANA, positive ENP antibody, low TSH, within normal range T4, T3.   I have personally reviewed MRI of the brain without contrast, that was normal.   REVIEW OF SYSTEMS: Full 14 system review of systems performed and notable only for headache, dizziness, blurry vision, double vision, eye pain,  ALLERGIES: No Known Allergies  HOME MEDICATIONS: Current Outpatient Prescriptions  Medication Sig Dispense Refill  . amitriptyline (ELAVIL) 25 MG  tablet     . hydrOXYzine (ATARAX/VISTARIL) 25 MG tablet Take 1 tablet (25 mg) three times daily as needed: For anxiety 45 tablet 0  . metFORMIN (GLUCOPHAGE) 500 MG tablet Take 1 tablet (500 mg total) by mouth daily with breakfast. For diabetes management    . Multiple Vitamin (MULTIVITAMIN WITH MINERALS) TABS tablet Take 1 tablet by mouth daily.    . QUEtiapine (SEROQUEL) 300 MG tablet Take 1 tablet (300 mg total) by mouth at bedtime. For mood control 30 tablet 0  . venlafaxine XR (EFFEXOR-XR) 150 MG 24 hr capsule Take 2 capsules (300 mg total) by mouth every morning. For depression 60 capsule 0     PAST MEDICAL HISTORY: Past Medical History  Diagnosis Date  . Diabetes mellitus without complication   . Cancer     breast  . Dysplasia of eye     PAST SURGICAL HISTORY: Past Surgical History  Procedure Laterality Date  . Gastric bypass    . Abdominal hysterectomy    . Mastectomy Left     FAMILY HISTORY: Family History  Problem Relation Age of Onset  . Diabetes type II Mother   . Hypertension Mother   . Stroke Mother   . Diabetes Father   . Heart disease Father   . Dementia Father     SOCIAL HISTORY:  Social History   Social History  . Marital Status: Married    Spouse Name: N/A  . Number of Children: 3  . Years of Education: Some coll   Occupational History  . Unemployed    Social History Main Topics  .  Smoking status: Current Every Day Smoker    Types: Cigarettes  . Smokeless tobacco: Never Used     Comment: 2-3 cigarettes per day.  . Alcohol Use: No  . Drug Use: No  . Sexual Activity: Not on file   Other Topics Concern  . Not on file   Social History Narrative   Lives at home with her husband.   Right-handed.   4 cups caffeine per day.     PHYSICAL EXAM   Filed Vitals:   10/16/14 1203  BP: 128/88  Pulse: 68  Height: 5\' 3"  (1.6 m)  Weight: 168 lb (76.204 kg)    Not recorded      Body mass index is 29.77 kg/(m^2).  PHYSICAL  EXAMNIATION:  Gen: NAD, conversant, well nourised, obese, well groomed                     Cardiovascular: Regular rate rhythm, no peripheral edema, warm, nontender. Eyes: Conjunctivae clear without exudates or hemorrhage Neck: Supple, no carotid bruise. Pulmonary: Clear to auscultation bilaterally   NEUROLOGICAL EXAM:  MENTAL STATUS: Speech:    Speech is normal; fluent and spontaneous with normal comprehension.  Cognition:     Orientation to time, place and person     Normal recent and remote memory     Normal Attention span and concentration     Normal Language, naming, repeating,spontaneous speech     Fund of knowledge   CRANIAL NERVES: CN II: Visual fields are full to confrontation. Fundoscopic exam is normal with sharp discs, mild left disc pale, Pupils are round equal and briskly reactive to light. CN III, IV, VI: She has bilateral lateral rectus weakness, demonstrated by cover and uncover testing, red lens testing.  CN V: Facial sensation is intact to pinprick in all 3 divisions bilaterally. Corneal responses are intact.  CN VII: She has mild eye-closure, cheek puff weakness  CN VIII: Hearing is normal to rubbing fingers CN IX, X: Palate elevates symmetrically. Phonation is normal. CN XI: Head turning and shoulder shrug are intact CN XII: Tongue is midline with normal movements and no atrophy.  MOTOR: She has slight neck flexion weakness, no significant upper lower extremity muscle weakness, normal tone, bulk  REFLEXES: Reflexes are 2+ and symmetric at the biceps, triceps, knees, and ankles. Plantar responses are flexor.  SENSORY: Intact to light touch, pinprick, position sense, and vibration sense are intact in fingers and toes.  COORDINATION: Rapid alternating movements and fine finger movements are intact. There is no dysmetria on finger-to-nose and heel-knee-shin.    GAIT/STANCE: Posture is normal. Gait is steady with normal steps, base, arm swing, and turning.  Heel and toe walking are normal. Tandem gait is normal.  Romberg is absent.   DIAGNOSTIC DATA (LABS, IMAGING, TESTING) - I reviewed patient records, labs, notes, testing and imaging myself where available.   ASSESSMENT AND PLAN  Sheryl Landry is a 54 y.o. female  with past medical history of right breast cancer, status post lobectomy, chemotherapy, presented with subacute onset of double vision  Differentiation diagnosis   neuromuscular junctional disorder  Acetylcholine receptor antibody  Mestinon 60 mg half to 1 tablet 3 times a day Insomnia, depression,  she is on polypharmacy treatment already with her history of bipolar disorder  She is grieving after lost her sister in August  Ambien 5 mg as needed    Marcial Pacas, M.D. Ph.D.  [facility] [address] [place], [place] [address]  Ph: [phone] Fax: [phone]  CC: Dr. Theda Sers, Dr. Lona Kettle

## 2014-10-30 ENCOUNTER — Telehealth: Payer: Self-pay | Admitting: Neurology

## 2014-10-30 LAB — THYROID PANEL WITH TSH
Free Thyroxine Index: 1.4 (ref 1.2–4.9)
T3 Uptake Ratio: 31 % (ref 24–39)
T4, Total: 4.5 ug/dL (ref 4.5–12.0)
TSH: 0.436 u[IU]/mL — ABNORMAL LOW (ref 0.450–4.500)

## 2014-10-30 LAB — ACETYLCHOLINE RECEPTOR, MODULATING: Acetylcholine Modulat Ab: <12 % (ref 0–20)

## 2014-10-30 LAB — ACETYLCHOLINE RECEPTOR, BINDING: AChR Binding Ab, Serum: 0.06 nmol/L (ref 0.00–0.24)

## 2014-10-30 NOTE — Telephone Encounter (Signed)
error 

## 2014-11-06 ENCOUNTER — Ambulatory Visit: Payer: Commercial Managed Care - HMO | Admitting: Neurology

## 2014-11-06 ENCOUNTER — Telehealth: Payer: Self-pay | Admitting: *Deleted

## 2014-11-06 NOTE — Telephone Encounter (Signed)
Patient called the morning of her appt - she is unable to come because her husband is sick (he is her driver).

## 2014-11-10 ENCOUNTER — Ambulatory Visit: Payer: Commercial Managed Care - HMO | Admitting: Neurology

## 2014-11-28 NOTE — Telephone Encounter (Signed)
Error

## 2015-01-10 ENCOUNTER — Telehealth: Payer: Self-pay | Admitting: Neurology

## 2015-01-10 NOTE — Telephone Encounter (Signed)
I returned call to answering service from patient and spoke to her. She has been having numbness and weakness on left side since Tuesday and was wondering if this was a pinched nerve. Since it was getting worse I advised her to go urgent care or ER and have it checked out. She was advised to call our office on Monday and request an urgent appointment to see Dr Krista Blue her neurologist if necessary.She voiced understanding

## 2015-03-06 ENCOUNTER — Other Ambulatory Visit: Payer: Self-pay | Admitting: Neurology

## 2015-03-10 ENCOUNTER — Encounter: Payer: Self-pay | Admitting: *Deleted

## 2015-03-11 ENCOUNTER — Telehealth: Payer: Self-pay | Admitting: Neurology

## 2015-03-11 NOTE — Telephone Encounter (Addendum)
FYI- Contacted pt to make f/u appt and had to leave message.  Gave pt my direct number and will try her back later if she doesn't call back. Update: 03/18/15 called pt again to attempt to schedule f/u appt.

## 2015-05-14 ENCOUNTER — Other Ambulatory Visit: Payer: Self-pay | Admitting: Neurology

## 2015-05-29 ENCOUNTER — Telehealth: Payer: Self-pay | Admitting: Neurology

## 2015-05-29 NOTE — Telephone Encounter (Signed)
Patient called with worsening double and triple vision. Advised to go to ER and call us during regular business hours for FU appointment as she has none pending, apparently no showed once and did not reschedule. She was advised for any sudden change in vision to proceed to ER. She demonstrated understanding and agreement.

## 2015-06-01 NOTE — Telephone Encounter (Addendum)
Spoke to paitent - pt has been scheduled for an appt this week.

## 2015-06-01 NOTE — Telephone Encounter (Signed)
Sheryl Landry, please call patient for an appointment

## 2015-06-04 ENCOUNTER — Ambulatory Visit: Payer: Self-pay | Admitting: Neurology

## 2015-06-04 ENCOUNTER — Telehealth: Payer: Self-pay | Admitting: *Deleted

## 2015-06-04 NOTE — Telephone Encounter (Signed)
No showed follow up appointment. 

## 2015-06-08 ENCOUNTER — Encounter: Payer: Self-pay | Admitting: Neurology

## 2015-11-11 ENCOUNTER — Other Ambulatory Visit: Payer: Self-pay | Admitting: Neurology

## 2016-07-06 DIAGNOSIS — E049 Nontoxic goiter, unspecified: Secondary | ICD-10-CM | POA: Diagnosis not present

## 2016-07-06 DIAGNOSIS — D649 Anemia, unspecified: Secondary | ICD-10-CM | POA: Diagnosis not present

## 2016-07-06 DIAGNOSIS — Z9884 Bariatric surgery status: Secondary | ICD-10-CM | POA: Diagnosis not present

## 2016-07-06 DIAGNOSIS — R451 Restlessness and agitation: Secondary | ICD-10-CM | POA: Diagnosis not present

## 2016-07-06 DIAGNOSIS — E119 Type 2 diabetes mellitus without complications: Secondary | ICD-10-CM | POA: Diagnosis not present

## 2016-08-29 DIAGNOSIS — M25561 Pain in right knee: Secondary | ICD-10-CM | POA: Diagnosis not present

## 2016-08-29 DIAGNOSIS — M25562 Pain in left knee: Secondary | ICD-10-CM | POA: Diagnosis not present

## 2016-09-23 DIAGNOSIS — M1711 Unilateral primary osteoarthritis, right knee: Secondary | ICD-10-CM | POA: Diagnosis not present

## 2016-09-23 DIAGNOSIS — M1712 Unilateral primary osteoarthritis, left knee: Secondary | ICD-10-CM | POA: Diagnosis not present

## 2016-09-30 DIAGNOSIS — M1712 Unilateral primary osteoarthritis, left knee: Secondary | ICD-10-CM | POA: Diagnosis not present

## 2016-09-30 DIAGNOSIS — M1711 Unilateral primary osteoarthritis, right knee: Secondary | ICD-10-CM | POA: Diagnosis not present

## 2016-10-07 DIAGNOSIS — M1711 Unilateral primary osteoarthritis, right knee: Secondary | ICD-10-CM | POA: Diagnosis not present

## 2016-10-07 DIAGNOSIS — M1712 Unilateral primary osteoarthritis, left knee: Secondary | ICD-10-CM | POA: Diagnosis not present

## 2016-11-14 DIAGNOSIS — E1165 Type 2 diabetes mellitus with hyperglycemia: Secondary | ICD-10-CM | POA: Diagnosis not present

## 2016-11-14 DIAGNOSIS — E049 Nontoxic goiter, unspecified: Secondary | ICD-10-CM | POA: Diagnosis not present

## 2016-11-14 DIAGNOSIS — Z23 Encounter for immunization: Secondary | ICD-10-CM | POA: Diagnosis not present

## 2016-11-14 DIAGNOSIS — G4700 Insomnia, unspecified: Secondary | ICD-10-CM | POA: Diagnosis not present

## 2016-11-14 DIAGNOSIS — Z0000 Encounter for general adult medical examination without abnormal findings: Secondary | ICD-10-CM | POA: Diagnosis not present

## 2016-11-14 DIAGNOSIS — E7800 Pure hypercholesterolemia, unspecified: Secondary | ICD-10-CM | POA: Diagnosis not present

## 2017-05-13 DIAGNOSIS — J209 Acute bronchitis, unspecified: Secondary | ICD-10-CM | POA: Diagnosis not present

## 2017-05-22 DIAGNOSIS — R05 Cough: Secondary | ICD-10-CM | POA: Diagnosis not present

## 2017-05-22 DIAGNOSIS — E119 Type 2 diabetes mellitus without complications: Secondary | ICD-10-CM | POA: Diagnosis not present

## 2017-05-30 DIAGNOSIS — E119 Type 2 diabetes mellitus without complications: Secondary | ICD-10-CM | POA: Diagnosis not present

## 2017-05-31 ENCOUNTER — Telehealth: Payer: Self-pay | Admitting: Neurology

## 2017-05-31 NOTE — Telephone Encounter (Signed)
Spoke to patient - she is aware of the results.  She requested a copy of results be faxed to Dr. Sharyne Peach.  He is the referring MD so I am able to take care of this request for her.  The results have been faxed and confirmed to his office (Fax#[phone])

## 2017-05-31 NOTE — Telephone Encounter (Signed)
Results from MRI brain on 10/05/14:  IMPRESSION:  This is a normal MRI of the brain without contrast for age. No acute findings are noted.

## 2017-05-31 NOTE — Telephone Encounter (Signed)
Pt is asking for a call with the results of her last MRI, pt states that information was never relayed to her

## 2017-12-11 DIAGNOSIS — Z0000 Encounter for general adult medical examination without abnormal findings: Secondary | ICD-10-CM | POA: Diagnosis not present

## 2017-12-15 DIAGNOSIS — Z0000 Encounter for general adult medical examination without abnormal findings: Secondary | ICD-10-CM | POA: Diagnosis not present

## 2017-12-15 DIAGNOSIS — G4700 Insomnia, unspecified: Secondary | ICD-10-CM | POA: Diagnosis not present

## 2017-12-15 DIAGNOSIS — E119 Type 2 diabetes mellitus without complications: Secondary | ICD-10-CM | POA: Diagnosis not present

## 2017-12-15 DIAGNOSIS — Z23 Encounter for immunization: Secondary | ICD-10-CM | POA: Diagnosis not present

## 2017-12-15 DIAGNOSIS — G4710 Hypersomnia, unspecified: Secondary | ICD-10-CM | POA: Diagnosis not present

## 2017-12-19 ENCOUNTER — Other Ambulatory Visit: Payer: Self-pay | Admitting: Family Medicine

## 2017-12-19 DIAGNOSIS — E049 Nontoxic goiter, unspecified: Secondary | ICD-10-CM

## 2017-12-21 ENCOUNTER — Ambulatory Visit
Admission: RE | Admit: 2017-12-21 | Discharge: 2017-12-21 | Disposition: A | Discharge status: Home / Self Care | Payer: 59 | Source: Ambulatory Visit | Attending: Family Medicine | Admitting: Family Medicine

## 2017-12-21 DIAGNOSIS — E049 Nontoxic goiter, unspecified: Secondary | ICD-10-CM | POA: Diagnosis not present

## 2017-12-22 ENCOUNTER — Other Ambulatory Visit: Payer: Self-pay

## 2018-01-09 ENCOUNTER — Institutional Professional Consult (permissible substitution): Payer: Self-pay | Admitting: Plastic Surgery

## 2018-02-28 DIAGNOSIS — Z9884 Bariatric surgery status: Secondary | ICD-10-CM | POA: Diagnosis not present

## 2018-03-01 ENCOUNTER — Other Ambulatory Visit: Payer: Self-pay | Admitting: Surgery

## 2018-03-11 NOTE — H&P (Signed)
Sheryl Landry  Location: [facility] Patient #: [id number] DOB: 28-May-1960 Married / Language: English / Race: Black or African American Female  History of Present Illness   The patient is a 58 year old female presenting status-post bariatric surgery. Patient well known to me status post laparoscopic gastric bypass for morbid obesity with surgery date March 2009. Also remote breast cancer patient of mine status post left mastectomy around 2000. I have not seen her for several years. She sees Dr. Harrington Challenger for primary care. She has had intermittent presentation with abdominal pain over at least the last 8 or 9 years. She previously has had a couple of negative workups, including negative laparoscopy approximately 2012. Negative CT scan and negative endoscopy at that time. She is actually had multiple negative CTs with ER presentations. The pain each time eventually resolves. Felt one point to be secondary to constipation.   She states that it had been doing okay for several years but in recent months notices increasing frequency and severity of pain. She describes epigastric pain immediately after eating. Also gets some pain without food. Burning in nature. More recently associated with vomiting of undigested food. No hematemesis. Bowel movements have been regular. Took Mylanta without any relief. States the pain is now very significant and not really tolerable anymore.   Past Surgical History Nance Pew, CMA; 02/28/2018 2:59 PM) Gastric Bypass  Hemorrhoidectomy  Mammoplasty; Reduction  Left. Mastectomy  Left.  Diagnostic Studies History Nance Pew, [place]; 02/28/2018 2:59 PM) Colonoscopy  1-5 years ago Mammogram  1-3 years ago  Allergies Nance Pew, CMA; 02/28/2018 2:59 PM) No Known Allergies [02/28/2018]: No Known Drug Allergies [02/28/2018]: Allergies Reconciled   Medication History Nance Pew, CMA; 02/28/2018 3:00 PM) buPROPion HCl ER (XL)  (150MG  Tablet ER 24HR, Oral) Active. metFORMIN HCl ER (500MG  Tablet ER 24HR, Oral) Active. QUEtiapine Fumarate (400MG  Tablet, Oral) Active. Venlafaxine HCl ER (150MG  Capsule ER 24HR, Oral) Active. Medications Reconciled  Social History Nance Pew, CMA; 02/28/2018 2:59 PM) Alcohol use  Occasional alcohol use. Caffeine use  Coffee. No drug use  Tobacco use  Current some day smoker.  Family History Nance Pew, [place]; 02/28/2018 2:59 PM) Family history unknown  First Degree Relatives   Pregnancy / Birth History Nance Pew, [place]; 02/28/2018 2:59 PM) Age at menarche  41 years. Age of menopause  <47 Gravida  3 Maternal age  24-20 Para  3  Other Problems Nance Pew, CMA; 02/28/2018 2:59 PM) Anxiety Disorder  Breast Cancer     Review of Systems ([place]; 02/28/2018 2:59 PM) General Present- Weight Gain. Not Present- Appetite Loss, Chills, Fatigue, Fever, Night Sweats and Weight Loss. Skin Not Present- Change in Wart/Mole, Dryness, Hives, Jaundice, New Lesions, Non-Healing Wounds, Rash and Ulcer. HEENT Not Present- Earache, Hearing Loss, Hoarseness, Nose Bleed, Oral Ulcers, Ringing in the Ears, Seasonal Allergies, Sinus Pain, Sore Throat, Visual Disturbances, Wears glasses/contact lenses and Yellow Eyes. Respiratory Not Present- Bloody sputum, Chronic Cough, Difficulty Breathing, Snoring and Wheezing. Breast Not Present- Breast Mass, Breast Pain, Nipple Discharge and Skin Changes. Cardiovascular Not Present- Chest Pain, Difficulty Breathing Lying Down, Leg Cramps, Palpitations, Rapid Heart Rate, Shortness of Breath and Swelling of Extremities. Gastrointestinal Present- Abdominal Pain and Bloating. Not Present- Bloody Stool, Change in Bowel Habits, Chronic diarrhea, Constipation, Difficulty Swallowing, Excessive gas, Gets full quickly at meals, Hemorrhoids, Indigestion, Nausea, Rectal Pain and Vomiting. Female Genitourinary Not Present- Frequency,  Nocturia, Painful Urination, Pelvic Pain and Urgency. Musculoskeletal Not Present- Back Pain, Joint Pain,  Joint Stiffness, Muscle Pain, Muscle Weakness and Swelling of Extremities. Neurological Not Present- Decreased Memory, Fainting, Headaches, Numbness, Seizures, Tingling, Tremor, Trouble walking and Weakness. Psychiatric Not Present- Anxiety, Bipolar, Change in Sleep Pattern, Depression, Fearful and Frequent crying. Endocrine Not Present- Cold Intolerance, Excessive Hunger, Hair Changes, Heat Intolerance, Hot flashes and New Diabetes. Hematology Not Present- Blood Thinners, Easy Bruising, Excessive bleeding, Gland problems, HIV and Persistent Infections.  Vitals (Sabrina Canty CMA; 02/28/2018 3:01 PM) 02/28/2018 3:00 PM Weight: 213.25 lb Height: 63in Body Surface Area: 1.99 m Body Mass Index: 37.78 kg/m  Temp.: 96.68F(Temporal)  Pulse: 103 (Regular)  P.OX: 97% (Room air) BP: 152/90 (Sitting, Left Arm, Standard)  Physical Exam  General: Overweight but otherwise well-appearing African American female no acute distress Skin: No rash or infection Lungs: Clear easy respirations Breasts: Healed reconstruction on the left. No chest wall masses or adenopathy. Right breast negative to exam. Cardiac: Regular rate and rhythm. No edema Abdomen: Moderate tenderness in the epigastrium and mild tenderness left and right upper quadrant. Slight voluntary guarding. No hernias. No masses. Lower abdomen is nontender. Extremities: No edema.  Assessment & Plan  1.  GASTRIC BYPASS STATUS FOR OBESITY (E07.12)  Impression: Status post gastric bypass 2009 for extreme morbid obesity. She had some moderate weight regain but is still down 85 pounds from preoperative. Remote history of breast cancer. She has had recurrent episodes of abdominal pain on a number of occasions over the years with extensive workups in the past unrevealing. However as I discussed with her that does not mean we should ignore her  current symptoms.   Her symptoms are most suggestive of potentially marginal ulcer. I'm going to set up an upper endoscopy with Dr. Lucia Gaskins if he is in agreement. Further workup pending this exam and clinically how she is doing at that point. She is no longer taking multivitamins and I told her is important she continue multivitamin and calcium supplements. Dr. Harrington Challenger is checking regular lab work. I'll be in touch with her following the endoscopy.  2. DM  Alphonsa Overall, MD, [facility] Pager: [phone] Office phone:  [phone]

## 2018-03-12 ENCOUNTER — Ambulatory Visit (HOSPITAL_COMMUNITY): Payer: 59 | Admitting: Registered Nurse

## 2018-03-12 ENCOUNTER — Other Ambulatory Visit: Payer: Self-pay

## 2018-03-12 ENCOUNTER — Ambulatory Visit (HOSPITAL_COMMUNITY)
Admission: RE | Admit: 2018-03-12 | Discharge: 2018-03-12 | Disposition: A | Discharge status: Home / Self Care | Payer: 59 | Attending: Surgery | Admitting: Surgery

## 2018-03-12 ENCOUNTER — Encounter (HOSPITAL_COMMUNITY)
Admission: RE | Disposition: A | Discharge status: Home / Self Care | Payer: Self-pay | Source: Home / Self Care | Attending: Surgery

## 2018-03-12 ENCOUNTER — Encounter (HOSPITAL_COMMUNITY): Payer: Self-pay | Admitting: Registered Nurse

## 2018-03-12 DIAGNOSIS — K5900 Constipation, unspecified: Secondary | ICD-10-CM | POA: Insufficient documentation

## 2018-03-12 DIAGNOSIS — F17210 Nicotine dependence, cigarettes, uncomplicated: Secondary | ICD-10-CM | POA: Diagnosis not present

## 2018-03-12 DIAGNOSIS — D649 Anemia, unspecified: Secondary | ICD-10-CM | POA: Diagnosis not present

## 2018-03-12 DIAGNOSIS — Z6837 Body mass index (BMI) 37.0-37.9, adult: Secondary | ICD-10-CM | POA: Insufficient documentation

## 2018-03-12 DIAGNOSIS — F419 Anxiety disorder, unspecified: Secondary | ICD-10-CM | POA: Diagnosis not present

## 2018-03-12 DIAGNOSIS — Z9884 Bariatric surgery status: Secondary | ICD-10-CM | POA: Insufficient documentation

## 2018-03-12 DIAGNOSIS — R1010 Upper abdominal pain, unspecified: Secondary | ICD-10-CM | POA: Diagnosis present

## 2018-03-12 DIAGNOSIS — E119 Type 2 diabetes mellitus without complications: Secondary | ICD-10-CM | POA: Diagnosis not present

## 2018-03-12 DIAGNOSIS — Z853 Personal history of malignant neoplasm of breast: Secondary | ICD-10-CM | POA: Diagnosis not present

## 2018-03-12 DIAGNOSIS — E669 Obesity, unspecified: Secondary | ICD-10-CM | POA: Diagnosis not present

## 2018-03-12 HISTORY — PX: ESOPHAGOGASTRODUODENOSCOPY (EGD) WITH PROPOFOL: SHX5813

## 2018-03-12 HISTORY — PX: BIOPSY: SHX5522

## 2018-03-12 LAB — GLUCOSE, CAPILLARY: Glucose-Capillary: 83 mg/dL (ref 70–99)

## 2018-03-12 LAB — CLOTEST (H. PYLORI), BIOPSY: Helicobacter screen: POSITIVE — AB

## 2018-03-12 SURGERY — ESOPHAGOGASTRODUODENOSCOPY (EGD) WITH PROPOFOL
Anesthesia: Monitor Anesthesia Care

## 2018-03-12 MED ORDER — PROPOFOL 500 MG/50ML IV EMUL
INTRAVENOUS | Status: DC | PRN
  Administered 2018-03-12: 300 ug/kg/min via INTRAVENOUS

## 2018-03-12 MED ORDER — LACTATED RINGERS IV SOLN
INTRAVENOUS | Status: DC
  Administered 2018-03-12: 1000 mL via INTRAVENOUS

## 2018-03-12 MED ORDER — PROPOFOL 10 MG/ML IV BOLUS
INTRAVENOUS | Status: AC
  Filled 2018-03-12: qty 40

## 2018-03-12 MED ORDER — LACTATED RINGERS IV SOLN: INTRAVENOUS | Status: DC

## 2018-03-12 MED ORDER — LIDOCAINE HCL (CARDIAC) PF 100 MG/5ML IV SOSY
PREFILLED_SYRINGE | INTRAVENOUS | Status: DC | PRN
  Administered 2018-03-12: 100 mg via INTRAVENOUS

## 2018-03-12 MED ORDER — SODIUM CHLORIDE 0.9 % IV SOLN: INTRAVENOUS | Status: DC

## 2018-03-12 MED ORDER — GLYCOPYRROLATE 0.2 MG/ML IJ SOLN
INTRAMUSCULAR | Status: DC | PRN
  Administered 2018-03-12: 0.1 mg via INTRAVENOUS

## 2018-03-12 MED ORDER — LACTATED RINGERS IV SOLN
INTRAVENOUS | Status: DC | PRN
  Administered 2018-03-12: 11:00:00 via INTRAVENOUS

## 2018-03-12 SURGICAL SUPPLY — 15 items

## 2018-03-12 NOTE — Transfer of Care (Signed)
Immediate Anesthesia Transfer of Care Note  Patient: Sheryl Landry  Procedure(s) Performed: ESOPHAGOGASTRODUODENOSCOPY (EGD) WITH PROPOFOL (N/A ) BIOPSY  Patient Location: PACU  Anesthesia Type:MAC  Level of Consciousness: awake, alert , oriented and patient cooperative  Airway & Oxygen Therapy: Patient Spontanous Breathing and Patient connected to nasal cannula oxygen  Post-op Assessment: Report given to RN, Post -op Vital signs reviewed and stable and Patient moving all extremities X 4  Post vital signs: stable  Last Vitals:  Vitals Value Taken Time  BP    Temp    Pulse 72 03/12/2018 11:19 AM  Resp 24 03/12/2018 11:19 AM  SpO2 100 % 03/12/2018 11:19 AM  Vitals shown include unvalidated device data.  Last Pain:  Vitals:   03/12/18 1014  TempSrc: Oral  PainSc: 0-No pain         Complications: No apparent anesthesia complications

## 2018-03-12 NOTE — Anesthesia Preprocedure Evaluation (Addendum)
Anesthesia Evaluation  Patient identified by MRN, date of birth, ID band Patient awake    Reviewed: Allergy & Precautions, NPO status , Patient's Chart, lab work & pertinent test results  Airway Mallampati: III  TM Distance: >3 FB Neck ROM: Full    Dental  (+) Lower Dentures, Upper Dentures   Pulmonary Current Smoker,    Pulmonary exam normal breath sounds clear to auscultation       Cardiovascular negative cardio ROS Normal cardiovascular exam Rhythm:Regular Rate:Normal     Neuro/Psych PSYCHIATRIC DISORDERS Anxiety negative neurological ROS     GI/Hepatic (+)     substance abuse  alcohol use, Hx/o Gastric bypass Abdominal pain   Endo/Other  diabetes, Well Controlled, Type 2  Renal/GU   negative genitourinary   Musculoskeletal   Abdominal (+) + obese,   Peds  Hematology  (+) anemia ,   Anesthesia Other Findings   Reproductive/Obstetrics                            Anesthesia Physical Anesthesia Plan  ASA: III  Anesthesia Plan: MAC   Post-op Pain Management:    Induction: Intravenous  PONV Risk Score and Plan: 1 and Treatment may vary due to age or medical condition, Ondansetron and Propofol infusion  Airway Management Planned: Nasal Cannula  Additional Equipment:   Intra-op Plan:   Post-operative Plan:   Informed Consent: I have reviewed the patients History and Physical, chart, labs and discussed the procedure including the risks, benefits and alternatives for the proposed anesthesia with the patient or authorized representative who has indicated his/her understanding and acceptance.       Plan Discussed with: CRNA and Surgeon  Anesthesia Plan Comments:        Anesthesia Quick Evaluation

## 2018-03-12 NOTE — Op Note (Signed)
03/12/2018  11:11 AM  PATIENT:  Sheryl Landry, 57 y.o., female, MRN: [id number]  PREOP DIAGNOSIS:  History of gastric bypass, upper abdominal pain  POSTOP DIAGNOSIS:   Normal gastric bypass anatomy  PROCEDURE:  Esophagogastrojejunoscopy  SURGEON:   Alphonsa Overall, M.D.  ANESTHESIA:   Anesthesiologist: Josephine Igo, MD CRNA: Lissa Morales, CRNA  INDICATIONS FOR PROCEDURE:  [name] is a 58 y.o. (DOB: 10/27/1960)  AA female whose primary care physician is Lawerance Cruel, MD and comes for upper endoscopy to evaluate upper abdominal pain.  The patient had a RYGB on March 2009 by Dr. Excell Seltzer..   The indications and risks of the endoscopy were explained to the patient.  The risks include, but are not limited to, perforation, bleeding, or injury to the bowel.  If balloon dilatation is needed, the risk of perforation is higher.  PROCEDURE:  The patient was in room 1 at [facility] endoscopy unit.  The patient was monitored with a pulse oximetry, BP cuff, and EKG.  The patient has nasal O2 flowing during the procedure.   A time was held prior to the procedure.   Sedation was provided by anesthesia.   A flexible Olympus endoscope was passed down the throat without difficulty.   Findings include:   Esophagus:   Normal   GE junction at:  33 cm   Stomach pouch: Normal.   Gastrojejunal anastomosis:   38 cm No evidence of ulcer or obstruction   Efferent jejunal limb:  Normal   Afferent jejunal limb:  Normal   CLO test:  Done  PLAN:   Photos taken and given to patient.    Follow up with Dr. Tawni Pummel, MD, [facility] Pager: [phone] Office phone:  [phone]

## 2018-03-12 NOTE — Discharge Instructions (Signed)
[facility] [place] [facility] - DISCHARGE INSTRUCTIONS TO PATIENT   Diet:  Resume bariatric diet  Follow up appointment:  Call Dr. Lear Ng office [facility]) at [phone] for an appointment in 6 weeks.  Medications and dosages:  Resume your home medications.  In an emergency, call 911 or go to an Emergency Department at a nearby hospital.

## 2018-03-12 NOTE — Anesthesia Procedure Notes (Signed)
Procedure Name: MAC Date/Time: 03/12/2018 10:51 AM Performed by: Lissa Morales, CRNA Pre-anesthesia Checklist: Patient identified, Emergency Drugs available, Suction available and Timeout performed Patient Re-evaluated:Patient Re-evaluated prior to induction Oxygen Delivery Method: Nasal cannula Placement Confirmation: positive ETCO2

## 2018-03-12 NOTE — Interval H&P Note (Signed)
History and Physical Interval Note:  03/12/2018 10:38 AM  Sheryl Landry  has presented today for surgery, with the diagnosis of History of gastric bypass and abdominal pain  The various methods of treatment have been discussed with the patient and family.   Her son, [name], has dropped her off.   After consideration of risks, benefits and other options for treatment, the patient has consented to  Procedure(s): ESOPHAGOGASTRODUODENOSCOPY (EGD) WITH PROPOFOL (N/A) as a surgical intervention .  The patient's history has been reviewed, patient examined, no change in status, stable for surgery.  I have reviewed the patient's chart and labs.  Questions were answered to the patient's satisfaction.     Shann Medal

## 2018-03-12 NOTE — Anesthesia Postprocedure Evaluation (Signed)
Anesthesia Post Note  Patient: SUVI ARCHULETTA  Procedure(s) Performed: ESOPHAGOGASTRODUODENOSCOPY (EGD) WITH PROPOFOL (N/A ) BIOPSY     Patient location during evaluation: PACU Anesthesia Type: MAC Level of consciousness: awake and alert and oriented Pain management: pain level controlled Vital Signs Assessment: post-procedure vital signs reviewed and stable Respiratory status: spontaneous breathing, nonlabored ventilation and respiratory function stable Cardiovascular status: stable and blood pressure returned to baseline Postop Assessment: no apparent nausea or vomiting Anesthetic complications: no    Last Vitals:  Vitals:   03/12/18 1130 03/12/18 1135  BP: 100/81 (!) 147/89  Pulse: 75 77  Resp: 17 20  Temp:    SpO2: 100% 100%    Last Pain:  Vitals:   03/12/18 1135  TempSrc:   PainSc: 0-No pain                 Jerrod Damiano A.

## 2018-04-19 DIAGNOSIS — A048 Other specified bacterial intestinal infections: Secondary | ICD-10-CM | POA: Diagnosis not present

## 2018-04-19 DIAGNOSIS — Z9884 Bariatric surgery status: Secondary | ICD-10-CM | POA: Diagnosis not present

## 2018-04-30 ENCOUNTER — Other Ambulatory Visit: Payer: Self-pay | Admitting: General Surgery

## 2018-04-30 ENCOUNTER — Ambulatory Visit
Admission: RE | Admit: 2018-04-30 | Discharge: 2018-04-30 | Disposition: A | Discharge status: Home / Self Care | Payer: 59 | Source: Ambulatory Visit | Attending: General Surgery | Admitting: General Surgery

## 2018-04-30 ENCOUNTER — Other Ambulatory Visit: Payer: Self-pay

## 2018-04-30 DIAGNOSIS — R1012 Left upper quadrant pain: Secondary | ICD-10-CM | POA: Diagnosis not present

## 2018-04-30 DIAGNOSIS — R110 Nausea: Secondary | ICD-10-CM

## 2018-04-30 MED ORDER — IOPAMIDOL (ISOVUE-300) INJECTION 61%
125.0000 mL | Freq: Once | INTRAVENOUS | Status: AC | PRN
  Administered 2018-04-30: 125 mL via INTRAVENOUS

## 2018-06-20 DIAGNOSIS — R635 Abnormal weight gain: Secondary | ICD-10-CM | POA: Diagnosis not present

## 2018-06-20 DIAGNOSIS — E119 Type 2 diabetes mellitus without complications: Secondary | ICD-10-CM | POA: Diagnosis not present

## 2018-06-20 DIAGNOSIS — R451 Restlessness and agitation: Secondary | ICD-10-CM | POA: Diagnosis not present

## 2018-11-02 ENCOUNTER — Telehealth: Payer: Self-pay | Admitting: Neurology

## 2018-11-02 NOTE — Telephone Encounter (Signed)
Pt called stating that she thought she was having pink eye symptoms but she never had the "oozing" and her eyes are crossing. She is also feeling tingling and numbing sensations on her face like if she was having a stroke. Pt was informed that the office does not open on Friday's and if she thinks she may be possibly having a stroke she needs to go to the ER. Pt stated she will go to a walk in clinic but would like to still speak to the RN on Monday. Please advise.

## 2018-11-02 NOTE — Telephone Encounter (Addendum)
I called the patient and she reports mild redness in her left eye present this past Monday that resolved two days ago. No discharge or itching ever occurred.  She still has mild tingling under her eye but no other symptoms. States she will sometimes get this feeling prior to having a migraine.  A migraine has not yet developed.  We discussed signs of stroke and she reported that she only has the mild tingling under her left eye.  She has no facial droop, no weakness on either side of body, no dizziness, no slurred speech, etc.  Says she has a family member that has experienced a stroke in the past and she is very familiar with the warning signs.    Says she is going to contact her PCP and eye doctor today.  She was last seen by our office in 10/2014.  If either her PCP or eye doctor feels she needs to return for neurological exam, she will ask them to send over a referral.    She verbalized understanding to proceed to ED for any symptoms she feels is life threatening.

## 2019-01-07 ENCOUNTER — Other Ambulatory Visit: Payer: Self-pay | Admitting: Family Medicine

## 2019-05-02 DIAGNOSIS — Z8669 Personal history of other diseases of the nervous system and sense organs: Secondary | ICD-10-CM

## 2019-05-02 HISTORY — DX: Personal history of other diseases of the nervous system and sense organs: Z86.69

## 2019-05-03 ENCOUNTER — Encounter: Payer: Self-pay | Admitting: Neurology

## 2019-05-03 ENCOUNTER — Ambulatory Visit: Payer: 59 | Admitting: Neurology

## 2019-05-03 ENCOUNTER — Other Ambulatory Visit: Payer: Self-pay

## 2019-05-03 VITALS — BP 128/84 | HR 85 | Temp 98.0°F | Ht 63.0 in | Wt 164.0 lb

## 2019-05-03 DIAGNOSIS — H532 Diplopia: Secondary | ICD-10-CM

## 2019-05-03 NOTE — Progress Notes (Signed)
PATIENT: Sheryl Landry DOB: 01/04/1961  Chief Complaint  Patient presents with  . 6Th Nerve palsy    New Pt      HISTORICAL  Sheryl Landry is a 59 year old female, seen in request by her ophthalmologist Dr. Delman Cheadle, [place] primary care physician  Dr. Harrington Challenger, Dwyane Luo for evaluation of double vision, disconjugate eye movement.  I actually seen her for exactly same complaint in 2016,  She had a past medical history of diabetes, left breast cancer,status post lobectomy in 1999, followed by chemotherapy, also had a history of gastric bypass with 175 pounds weight loss.  This time she complains of sudden onset of double vision while watching TV around April 15, 2019, everything was blurry, if she closes either left or right eye, she can see better, she described horizontal sometimes vertical, oblique diplopia.  She denies dysarthria, dysphagia, no limb muscle weakness, does complains of fatigue, chronic insomnia.  When she looked at herself in the mirror, she noticed her left eye tends to turn inward towards her nose.  She has history of depression anxiety, is on polypharmacy treatment, Wellbutrin 450 mg daily, Seroquel 400 mg at bedtime, she continue complains of difficulty sleeping despite polypharmacy treatment  I reviewed ophthalmology evaluation by Dr. 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 Delman Cheadle evaluation on August 5th 2016, documented visual acuity, OD 20/30, OS 20/60 evidence of left sixth nerve palsy,  Previous examination  bilateral lateral rectus weakness, demonstrated by cover and  uncover testing, red lens testing.  I have personally reviewed MRI of the brain without contrast in September 2016, that was normal.   Laboratory evaluations in 2016 showed negative acetylcholine receptor antibody, normal B12, thyroid functional test showed decreased TSH, but was normal total T4,   REVIEW OF SYSTEMS: Full 14 system review of systems performed and notable only for as above All other review of systems were negative.  ALLERGIES: No Known Allergies  HOME MEDICATIONS: Current Outpatient Medications  Medication Sig Dispense Refill  . buPROPion (WELLBUTRIN XL) 150 MG 24 hr tablet Take 150 mg by mouth every evening.    Marland Kitchen buPROPion (WELLBUTRIN XL) 300 MG 24 hr tablet Take 300 mg by mouth every morning.    Marland Kitchen CALCIUM PO Take 1 tablet by mouth every evening.    . metFORMIN (GLUCOPHAGE) 500 MG tablet Take 1 tablet (500 mg total) by mouth daily with breakfast. For diabetes management (Patient taking differently: Take 500 mg by mouth every evening. For diabetes management)    . Multiple Vitamin (MULTIVITAMIN WITH MINERALS) TABS tablet Take 1 tablet by mouth every evening.     Marland Kitchen QUEtiapine (SEROQUEL) 400 MG tablet Take 400 mg by mouth at bedtime.     No current facility-administered medications for this visit.    PAST MEDICAL HISTORY: Past Medical History:  Diagnosis Date  . Cancer ([place])    breast  . Diabetes mellitus without complication ([place])   . Dysplasia of eye     PAST SURGICAL HISTORY: Past Surgical History:  Procedure Laterality Date  . ABDOMINAL HYSTERECTOMY    . BIOPSY  03/12/2018   Procedure: BIOPSY;  Surgeon: Alphonsa Overall, MD;  Location: [facility];  Service: General;;  . ESOPHAGOGASTRODUODENOSCOPY (EGD) WITH PROPOFOL N/A 03/12/2018   Procedure: ESOPHAGOGASTRODUODENOSCOPY (EGD) WITH PROPOFOL;  Surgeon: Alphonsa Overall, MD;  Location: [facility];  Service: General;  Laterality: N/A;  . GASTRIC BYPASS    . MASTECTOMY Left    s/p chemo x 8    FAMILY  HISTORY: Family History  Problem Relation Age of Onset  . Diabetes Father   . Heart disease Father   . Dementia Father   . Diabetes type II Mother   . Hypertension Mother   . Stroke Mother     SOCIAL HISTORY: Social History   Socioeconomic History  . Marital status: Married    Spouse name: Not on file  . Number of children: 3  . Years of education: Some coll  . Highest education level: Not on file  Occupational History  . Occupation: Unemployed  Tobacco Use  . Smoking status: Former Smoker    Types: Cigarettes    Quit date: 12/03/2018    Years since quitting: 0.4  . Smokeless tobacco: Never Used  . Tobacco comment: 2-3 cigarettes per day.  Substance and Sexual Activity  . Alcohol use: No    Alcohol/week: 0.0 standard drinks  . Drug use: No  . Sexual activity: Not on file  Other Topics Concern  . Not on file  Social History Narrative   Lives at home with her husband.   Right-handed.   4 cups caffeine per day.   Social Determinants of Health   Financial Resource Strain:   . Difficulty of Paying Living Expenses:   Food Insecurity:   . Worried About Charity fundraiser in the Last Year:   . Arboriculturist in the Last Year:   Transportation Needs:   . Film/video editor (Medical):   Marland Kitchen Lack of Transportation (Non-Medical):   Physical Activity:   . Days of Exercise per Week:   . Minutes of Exercise per Session:   Stress:   . Feeling of Stress :   Social Connections:   . Frequency of Communication with Friends and Family:   . Frequency of Social Gatherings with Friends and Family:   . Attends Religious Services:   . Active Member of Clubs or Organizations:   . Attends Archivist Meetings:   Marland Kitchen Marital Status:   Intimate Partner Violence:   . Fear of Current or Ex-Partner:   . Emotionally Abused:   Marland Kitchen Physically Abused:   . Sexually Abused:      PHYSICAL EXAM   Vitals:   05/03/19 0841  BP: 128/84  Pulse: 85  Temp: 98 F (36.7 C)    Weight: 164 lb (74.4 kg)  Height: 5\' 3"  (1.6 m)    Not recorded      Body mass index is 29.05 kg/m.  PHYSICAL EXAMNIATION:  Gen: NAD, conversant, well nourised, well groomed                     Cardiovascular: Regular rate rhythm, no peripheral edema, warm, nontender. Eyes: Conjunctivae clear without exudates or hemorrhage Neck: Supple, no carotid bruits. Pulmonary: Clear to auscultation bilaterally   NEUROLOGICAL EXAM:  MENTAL STATUS: Speech:    Speech is normal; fluent and spontaneous with normal comprehension.  Cognition:     Orientation to time, place and person     Normal recent and remote memory     Normal Attention span and concentration  Normal Language, naming, repeating,spontaneous speech     Fund of knowledge   CRANIAL NERVES: CN II: Visual fields are full to confrontation. Pupils are round equal and briskly reactive to light. CN III, IV, VI: She has intermittent left abductor difficulty, cover uncover, red lens testing showed bilateral abductor weakness, worsening on the left side, CN V: Facial sensation is intact to light touch CN VII: Face is symmetric with normal eye closure  CN VIII: Hearing is normal to causal conversation. CN IX, X: Phonation is normal. CN XI: Head turning and shoulder shrug are intact CN XII: Tongue movement was normal  MOTOR: There is no pronator drift of out-stretched arms. Muscle bulk and tone are normal. Muscle strength is normal.  REFLEXES: Reflexes are 2+ and symmetric at the biceps, triceps, knees, and ankles. Plantar responses are flexor.  SENSORY: Intact to light touch, pinprick and vibratory sensation are intact in fingers and toes.  COORDINATION: There is no trunk or limb dysmetria noted.  GAIT/STANCE: Posture is normal. Gait is steady with normal steps, base, arm swing, and turning.   DIAGNOSTIC DATA (LABS, IMAGING, TESTING) - I reviewed patient records, labs, notes, testing and imaging myself where  available.   ASSESSMENT AND PLAN  BRIDGETTA MCQUILLAN is a 59 y.o. 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 Ph.D.  [facility] [address], [place], [place] [address] Ph: [phone] Fax: [phone]  CC: Sharyne Peach, MD, Lawerance Cruel, MD

## 2019-05-08 ENCOUNTER — Telehealth: Payer: Self-pay | Admitting: Neurology

## 2019-05-08 ENCOUNTER — Other Ambulatory Visit: Payer: Self-pay | Admitting: Neurology

## 2019-05-08 MED ORDER — ALPRAZOLAM 1 MG PO TABS: ORAL_TABLET | ORAL | 0 refills | Status: DC

## 2019-05-08 NOTE — Telephone Encounter (Signed)
Pt called wanting to know if provider can call something in for her headache that she is having and also if something like Ambien can be called in as well to help her rest. Please advise.

## 2019-05-08 NOTE — Telephone Encounter (Signed)
no to the covid questions MR Brain wo contrast & MRA Head wo contrast Dr. Krista Blue [facility] Auth: [place] via [facility] website. Patient is scheduled at [facility] for 05/15/19.   Patient also informed me she is claustrophobic and would like something to help her. She is aware to have a driver.

## 2019-05-08 NOTE — Telephone Encounter (Signed)
I returned the call to the patient. She confirmed that she has no known drug allergies. Rx for xanax 1mg , per MRI protocol, will be sent to Dr. Krista Blue for approval. The patient verbalized understanding that she must have a driver to and from her tests.

## 2019-05-08 NOTE — Telephone Encounter (Signed)
I spoke to the patient and she is going to try OTC Aleve or Advil for the headache she woke up with this morning.

## 2019-05-13 LAB — MYASTHENIA GRAVIS FULL PANEL
AChR Binding Ab, Serum: <0.03 nmol/L (ref 0.00–0.24)
Acetylchol Block Ab: 12 % (ref 0–25)
Acetylcholine Modulat Ab: <12 % (ref 0–20)
Anti-striation Abs: NEGATIVE

## 2019-05-13 LAB — VITAMIN B12: Vitamin B-12: 402 pg/mL (ref 232–1245)

## 2019-05-13 LAB — THYROID PANEL WITH TSH
Free Thyroxine Index: 1 — ABNORMAL LOW (ref 1.2–4.9)
T3 Uptake Ratio: 29 % (ref 24–39)
T4, Total: 3.6 ug/dL — ABNORMAL LOW (ref 4.5–12.0)
TSH: 1.64 u[IU]/mL (ref 0.450–4.500)

## 2019-05-13 LAB — HEMOGLOBIN A1C
Est. average glucose Bld gHb Est-mCnc: 117 mg/dL
Hgb A1c MFr Bld: 5.7 % — ABNORMAL HIGH (ref 4.8–5.6)

## 2019-05-13 LAB — CBC WITH DIFFERENTIAL/PLATELET
Basophils Absolute: 0.1 10*3/uL (ref 0.0–0.2)
Basos: 1 %
EOS (ABSOLUTE): 0.1 10*3/uL (ref 0.0–0.4)
Eos: 1 %
Hematocrit: 36.1 % (ref 34.0–46.6)
Hemoglobin: 11.6 g/dL (ref 11.1–15.9)
Immature Grans (Abs): 0 10*3/uL (ref 0.0–0.1)
Immature Granulocytes: 0 %
Lymphocytes Absolute: 2.6 10*3/uL (ref 0.7–3.1)
Lymphs: 41 %
MCH: 26.9 pg (ref 26.6–33.0)
MCHC: 32.1 g/dL (ref 31.5–35.7)
MCV: 84 fL (ref 79–97)
Monocytes Absolute: 0.5 10*3/uL (ref 0.1–0.9)
Monocytes: 8 %
Neutrophils Absolute: 3 10*3/uL (ref 1.4–7.0)
Neutrophils: 49 %
Platelets: 340 10*3/uL (ref 150–450)
RBC: 4.32 x10E6/uL (ref 3.77–5.28)
RDW: 14.5 % (ref 11.7–15.4)
WBC: 6.2 10*3/uL (ref 3.4–10.8)

## 2019-05-13 LAB — COMPREHENSIVE METABOLIC PANEL
ALT: 24 IU/L (ref 0–32)
AST: 29 IU/L (ref 0–40)
Albumin/Globulin Ratio: 1.9 (ref 1.2–2.2)
Albumin: 4.4 g/dL (ref 3.8–4.9)
Alkaline Phosphatase: 112 IU/L (ref 39–117)
BUN/Creatinine Ratio: 10 (ref 9–23)
BUN: 9 mg/dL (ref 6–24)
Bilirubin Total: <0.2 mg/dL (ref 0.0–1.2)
CO2: 18 mmol/L — ABNORMAL LOW (ref 20–29)
Calcium: 8.9 mg/dL (ref 8.7–10.2)
Chloride: 108 mmol/L — ABNORMAL HIGH (ref 96–106)
Creatinine, Ser: 0.91 mg/dL (ref 0.57–1.00)
GFR calc Af Amer: 81 mL/min/{1.73_m2} (ref >59)
GFR calc non Af Amer: 70 mL/min/{1.73_m2} (ref >59)
Globulin, Total: 2.3 g/dL (ref 1.5–4.5)
Glucose: 65 mg/dL (ref 65–99)
Potassium: 3.7 mmol/L (ref 3.5–5.2)
Sodium: 142 mmol/L (ref 134–144)
Total Protein: 6.7 g/dL (ref 6.0–8.5)

## 2019-05-13 LAB — RPR: RPR Ser Ql: NONREACTIVE

## 2019-05-13 LAB — CK: Total CK: 54 U/L (ref 32–182)

## 2019-05-13 LAB — HIV ANTIBODY (ROUTINE TESTING W REFLEX): HIV Screen 4th Generation wRfx: NONREACTIVE

## 2019-05-14 ENCOUNTER — Telehealth: Payer: Self-pay | Admitting: Neurology

## 2019-05-14 NOTE — Telephone Encounter (Signed)
Please call patient, laboratory evaluation showed slightly elevated A1C 5.7, rest of the laboratory evaluations showed no significant abnormalities.

## 2019-05-14 NOTE — Telephone Encounter (Signed)
I spoke to the patient and she verbalized understanding of the lab results.

## 2019-05-15 ENCOUNTER — Ambulatory Visit (INDEPENDENT_AMBULATORY_CARE_PROVIDER_SITE_OTHER): Payer: 59

## 2019-05-15 ENCOUNTER — Other Ambulatory Visit: Payer: Self-pay

## 2019-05-15 DIAGNOSIS — H532 Diplopia: Secondary | ICD-10-CM | POA: Diagnosis not present

## 2019-05-20 ENCOUNTER — Other Ambulatory Visit: Payer: Self-pay | Admitting: Family Medicine

## 2019-05-20 ENCOUNTER — Telehealth: Payer: Self-pay | Admitting: Neurology

## 2019-05-20 DIAGNOSIS — Z1231 Encounter for screening mammogram for malignant neoplasm of breast: Secondary | ICD-10-CM

## 2019-05-20 DIAGNOSIS — N6310 Unspecified lump in the right breast, unspecified quadrant: Secondary | ICD-10-CM

## 2019-05-20 DIAGNOSIS — Z853 Personal history of malignant neoplasm of breast: Secondary | ICD-10-CM

## 2019-05-20 NOTE — Telephone Encounter (Signed)
Please call patient, MRI of brain, MRA of brain showed no significant abnormalities.

## 2019-05-20 NOTE — Telephone Encounter (Signed)
I spoke to the patient and she verbalized understanding of the results below. °

## 2019-05-29 ENCOUNTER — Other Ambulatory Visit: Payer: Self-pay | Admitting: Family Medicine

## 2019-05-29 DIAGNOSIS — N6310 Unspecified lump in the right breast, unspecified quadrant: Secondary | ICD-10-CM

## 2019-05-29 DIAGNOSIS — N63 Unspecified lump in unspecified breast: Secondary | ICD-10-CM

## 2019-05-31 ENCOUNTER — Other Ambulatory Visit: Payer: Self-pay | Admitting: Family Medicine

## 2019-05-31 ENCOUNTER — Ambulatory Visit
Admission: RE | Admit: 2019-05-31 | Discharge: 2019-05-31 | Disposition: A | Discharge status: Home / Self Care | Payer: 59 | Source: Ambulatory Visit | Attending: Family Medicine | Admitting: Family Medicine

## 2019-05-31 ENCOUNTER — Other Ambulatory Visit: Payer: Self-pay

## 2019-05-31 DIAGNOSIS — R599 Enlarged lymph nodes, unspecified: Secondary | ICD-10-CM

## 2019-05-31 DIAGNOSIS — N63 Unspecified lump in unspecified breast: Secondary | ICD-10-CM

## 2019-05-31 DIAGNOSIS — N6310 Unspecified lump in the right breast, unspecified quadrant: Secondary | ICD-10-CM

## 2019-07-02 ENCOUNTER — Other Ambulatory Visit: Payer: 59

## 2019-07-26 ENCOUNTER — Other Ambulatory Visit: Payer: Self-pay

## 2019-07-26 ENCOUNTER — Ambulatory Visit
Admission: RE | Admit: 2019-07-26 | Discharge: 2019-07-26 | Disposition: A | Discharge status: Home / Self Care | Payer: 59 | Source: Ambulatory Visit | Attending: Family Medicine | Admitting: Family Medicine

## 2019-07-26 DIAGNOSIS — R599 Enlarged lymph nodes, unspecified: Secondary | ICD-10-CM

## 2019-08-12 ENCOUNTER — Ambulatory Visit: Payer: 59 | Admitting: Neurology

## 2020-01-11 ENCOUNTER — Emergency Department (HOSPITAL_COMMUNITY)
Admission: EM | Admit: 2020-01-11 | Discharge: 2020-01-12 | Disposition: A | Discharge status: Home / Self Care | Payer: 59 | Attending: Emergency Medicine | Admitting: Emergency Medicine

## 2020-01-11 DIAGNOSIS — Z20822 Contact with and (suspected) exposure to covid-19: Secondary | ICD-10-CM | POA: Insufficient documentation

## 2020-01-11 DIAGNOSIS — Z87891 Personal history of nicotine dependence: Secondary | ICD-10-CM | POA: Diagnosis not present

## 2020-01-11 DIAGNOSIS — R4689 Other symptoms and signs involving appearance and behavior: Secondary | ICD-10-CM

## 2020-01-11 DIAGNOSIS — R456 Violent behavior: Secondary | ICD-10-CM | POA: Diagnosis not present

## 2020-01-11 DIAGNOSIS — E119 Type 2 diabetes mellitus without complications: Secondary | ICD-10-CM | POA: Insufficient documentation

## 2020-01-11 DIAGNOSIS — Z7982 Long term (current) use of aspirin: Secondary | ICD-10-CM | POA: Insufficient documentation

## 2020-01-11 DIAGNOSIS — Z853 Personal history of malignant neoplasm of breast: Secondary | ICD-10-CM | POA: Diagnosis not present

## 2020-01-11 DIAGNOSIS — R45851 Suicidal ideations: Secondary | ICD-10-CM

## 2020-01-11 DIAGNOSIS — F1010 Alcohol abuse, uncomplicated: Secondary | ICD-10-CM

## 2020-01-11 DIAGNOSIS — E8889 Other specified metabolic disorders: Secondary | ICD-10-CM

## 2020-01-11 DIAGNOSIS — Z046 Encounter for general psychiatric examination, requested by authority: Secondary | ICD-10-CM | POA: Diagnosis not present

## 2020-01-11 DIAGNOSIS — F10229 Alcohol dependence with intoxication, unspecified: Secondary | ICD-10-CM | POA: Insufficient documentation

## 2020-01-11 DIAGNOSIS — Z7984 Long term (current) use of oral hypoglycemic drugs: Secondary | ICD-10-CM | POA: Diagnosis not present

## 2020-01-11 DIAGNOSIS — F10920 Alcohol use, unspecified with intoxication, uncomplicated: Secondary | ICD-10-CM

## 2020-01-11 DIAGNOSIS — Y908 Blood alcohol level of 240 mg/100 ml or more: Secondary | ICD-10-CM | POA: Insufficient documentation

## 2020-01-11 LAB — CBC WITH DIFFERENTIAL/PLATELET
Abs Immature Granulocytes: 0.02 10*3/uL (ref 0.00–0.07)
Basophils Absolute: 0 10*3/uL (ref 0.0–0.1)
Basophils Relative: 0 %
Eosinophils Absolute: 0.1 10*3/uL (ref 0.0–0.5)
Eosinophils Relative: 1 %
HCT: 39.2 % (ref 36.0–46.0)
Hemoglobin: 12.2 g/dL (ref 12.0–15.0)
Immature Granulocytes: 0 %
Lymphocytes Relative: 50 %
Lymphs Abs: 3.5 10*3/uL (ref 0.7–4.0)
MCH: 26.7 pg (ref 26.0–34.0)
MCHC: 31.1 g/dL (ref 30.0–36.0)
MCV: 85.8 fL (ref 80.0–100.0)
Monocytes Absolute: 0.4 10*3/uL (ref 0.1–1.0)
Monocytes Relative: 5 %
Neutro Abs: 3.1 10*3/uL (ref 1.7–7.7)
Neutrophils Relative %: 44 %
Platelets: 367 10*3/uL (ref 150–400)
RBC: 4.57 MIL/uL (ref 3.87–5.11)
RDW: 16.3 % — ABNORMAL HIGH (ref 11.5–15.5)
WBC: 7 10*3/uL (ref 4.0–10.5)
nRBC: 0 % (ref 0.0–0.2)

## 2020-01-11 LAB — PROTIME-INR
INR: 0.9 (ref 0.8–1.2)
Prothrombin Time: 12.2 seconds (ref 11.4–15.2)

## 2020-01-11 LAB — URINALYSIS, ROUTINE W REFLEX MICROSCOPIC
Bilirubin Urine: NEGATIVE
Glucose, UA: 50 mg/dL — AB
Hgb urine dipstick: NEGATIVE
Ketones, ur: NEGATIVE mg/dL
Nitrite: NEGATIVE
Protein, ur: NEGATIVE mg/dL
Specific Gravity, Urine: 1.009 (ref 1.005–1.030)
pH: 6 (ref 5.0–8.0)

## 2020-01-11 LAB — COMPREHENSIVE METABOLIC PANEL
ALT: 46 U/L — ABNORMAL HIGH (ref 0–44)
AST: 72 U/L — ABNORMAL HIGH (ref 15–41)
Albumin: 4.1 g/dL (ref 3.5–5.0)
Alkaline Phosphatase: 106 U/L (ref 38–126)
Anion gap: 18 — ABNORMAL HIGH (ref 5–15)
BUN: 12 mg/dL (ref 6–20)
CO2: 19 mmol/L — ABNORMAL LOW (ref 22–32)
Calcium: 8.5 mg/dL — ABNORMAL LOW (ref 8.9–10.3)
Chloride: 107 mmol/L (ref 98–111)
Creatinine, Ser: 0.78 mg/dL (ref 0.44–1.00)
GFR, Estimated: >60 mL/min (ref >60)
Glucose, Bld: 82 mg/dL (ref 70–99)
Potassium: 4.3 mmol/L (ref 3.5–5.1)
Sodium: 144 mmol/L (ref 135–145)
Total Bilirubin: 0.2 mg/dL — ABNORMAL LOW (ref 0.3–1.2)
Total Protein: 7.9 g/dL (ref 6.5–8.1)

## 2020-01-11 LAB — CBG MONITORING, ED
Glucose-Capillary: 101 mg/dL — ABNORMAL HIGH (ref 70–99)
Glucose-Capillary: 75 mg/dL (ref 70–99)

## 2020-01-11 LAB — RESP PANEL BY RT-PCR (FLU A&B, COVID) ARPGX2
Influenza A by PCR: NEGATIVE
Influenza B by PCR: NEGATIVE
SARS Coronavirus 2 by RT PCR: NEGATIVE

## 2020-01-11 LAB — RAPID URINE DRUG SCREEN, HOSP PERFORMED
Amphetamines: NOT DETECTED
Barbiturates: NOT DETECTED
Benzodiazepines: NOT DETECTED
Cocaine: NOT DETECTED
Opiates: NOT DETECTED
Tetrahydrocannabinol: NOT DETECTED

## 2020-01-11 LAB — ACETAMINOPHEN LEVEL: Acetaminophen (Tylenol), Serum: <10 ug/mL — ABNORMAL LOW (ref 10–30)

## 2020-01-11 LAB — ETHANOL: Alcohol, Ethyl (B): 259 mg/dL — ABNORMAL HIGH (ref <10)

## 2020-01-11 LAB — MAGNESIUM: Magnesium: 2.4 mg/dL (ref 1.7–2.4)

## 2020-01-11 LAB — SALICYLATE LEVEL: Salicylate Lvl: <7 mg/dL — ABNORMAL LOW (ref 7.0–30.0)

## 2020-01-11 MED ORDER — LACTATED RINGERS IV SOLN: INTRAVENOUS | Status: DC

## 2020-01-11 MED ORDER — THIAMINE HCL 100 MG/ML IJ SOLN
100.0000 mg | Freq: Once | INTRAMUSCULAR | Status: AC
  Administered 2020-01-11: 100 mg via INTRAVENOUS
  Filled 2020-01-11: qty 2

## 2020-01-11 MED ORDER — HALOPERIDOL LACTATE 5 MG/ML IJ SOLN
5.0000 mg | Freq: Once | INTRAMUSCULAR | Status: AC
  Administered 2020-01-11: 5 mg via INTRAMUSCULAR
  Filled 2020-01-11: qty 1

## 2020-01-11 MED ORDER — DIPHENHYDRAMINE HCL 50 MG/ML IJ SOLN
25.0000 mg | Freq: Once | INTRAMUSCULAR | Status: AC
  Administered 2020-01-11: 25 mg via INTRAMUSCULAR
  Filled 2020-01-11: qty 1

## 2020-01-11 MED ORDER — DEXTROSE 50 % IV SOLN
25.0000 mL | Freq: Once | INTRAVENOUS | Status: AC
  Administered 2020-01-11: 25 mL via INTRAVENOUS
  Filled 2020-01-11: qty 50

## 2020-01-11 NOTE — ED Triage Notes (Addendum)
Patient brought in by [facility] under IVC from husband. According to IVC, patient with hx of depression and DM and unsure if taking meds. Patient drinks every day since 2012. Patient talks about killing herself but doesn't have plan. Patient accuses family of taking her belongings. Patient pulls all her hair out intentionally. Tries to talk to neighbors while intoxicated. Patient arrives in handcuffs, placed in soft restraints. Patient is verbally aggressive.
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
## 2020-01-12 LAB — BASIC METABOLIC PANEL
Anion gap: 14 (ref 5–15)
BUN: 10 mg/dL (ref 6–20)
CO2: 20 mmol/L — ABNORMAL LOW (ref 22–32)
Calcium: 7.9 mg/dL — ABNORMAL LOW (ref 8.9–10.3)
Chloride: 106 mmol/L (ref 98–111)
Creatinine, Ser: 0.62 mg/dL (ref 0.44–1.00)
GFR, Estimated: >60 mL/min (ref >60)
Glucose, Bld: 76 mg/dL (ref 70–99)
Potassium: 5.2 mmol/L — ABNORMAL HIGH (ref 3.5–5.1)
Sodium: 140 mmol/L (ref 135–145)

## 2020-01-12 LAB — HEMOGLOBIN A1C
Hgb A1c MFr Bld: 5.5 % (ref 4.8–5.6)
Mean Plasma Glucose: 111.15 mg/dL

## 2020-01-12 MED ORDER — BUPROPION HCL ER (XL) 150 MG PO TB24
300.0000 mg | ORAL_TABLET | Freq: Every day | ORAL | Status: DC
  Administered 2020-01-12: 300 mg via ORAL
  Filled 2020-01-12: qty 2

## 2020-01-12 MED ORDER — LACTATED RINGERS IV BOLUS
500.0000 mL | Freq: Once | INTRAVENOUS | Status: AC
  Administered 2020-01-12: 500 mL via INTRAVENOUS

## 2020-01-12 MED ORDER — QUETIAPINE FUMARATE 300 MG PO TABS: 400.0000 mg | ORAL_TABLET | Freq: Every day | ORAL | Status: DC

## 2020-01-12 MED ORDER — ASPIRIN 81 MG PO TBEC: 81.0000 mg | DELAYED_RELEASE_TABLET | Freq: Every day | ORAL | Status: DC

## 2020-01-12 MED ORDER — HYDROXYZINE HCL 25 MG PO TABS
25.0000 mg | ORAL_TABLET | Freq: Every day | ORAL | Status: DC
  Administered 2020-01-12: 25 mg via ORAL
  Filled 2020-01-12: qty 1

## 2020-01-12 MED ORDER — LORAZEPAM 2 MG/ML IJ SOLN: 1.0000 mg | INTRAMUSCULAR | Status: DC | PRN

## 2020-01-12 MED ORDER — BUPROPION HCL ER (XL) 150 MG PO TB24: 300.0000 mg | ORAL_TABLET | Freq: Every morning | ORAL | Status: DC

## 2020-01-12 MED ORDER — VENLAFAXINE HCL ER 75 MG PO CP24
300.0000 mg | ORAL_CAPSULE | Freq: Every day | ORAL | Status: DC
  Administered 2020-01-12: 300 mg via ORAL

## 2020-01-12 MED ORDER — THIAMINE HCL 100 MG/ML IJ SOLN: 100.0000 mg | Freq: Every day | INTRAMUSCULAR | Status: DC

## 2020-01-12 MED ORDER — METFORMIN HCL 500 MG PO TABS
1000.0000 mg | ORAL_TABLET | Freq: Every evening | ORAL | Status: DC
  Filled 2020-01-12: qty 2

## 2020-01-12 MED ORDER — QUETIAPINE FUMARATE 300 MG PO TABS
400.0000 mg | ORAL_TABLET | Freq: Every day | ORAL | Status: DC
  Administered 2020-01-12: 400 mg via ORAL
  Filled 2020-01-12: qty 1

## 2020-01-12 MED ORDER — LORAZEPAM 1 MG PO TABS: 0.0000 mg | ORAL_TABLET | Freq: Two times a day (BID) | ORAL | Status: DC

## 2020-01-12 MED ORDER — THIAMINE HCL 100 MG PO TABS
100.0000 mg | ORAL_TABLET | Freq: Every day | ORAL | Status: DC
  Administered 2020-01-12: 100 mg via ORAL
  Filled 2020-01-12: qty 1

## 2020-01-12 MED ORDER — FOLIC ACID 1 MG PO TABS
1.0000 mg | ORAL_TABLET | Freq: Every day | ORAL | Status: DC
  Administered 2020-01-12: 1 mg via ORAL
  Filled 2020-01-12: qty 1

## 2020-01-12 MED ORDER — LORAZEPAM 1 MG PO TABS: 1.0000 mg | ORAL_TABLET | ORAL | Status: DC | PRN

## 2020-01-12 MED ORDER — LACTATED RINGERS IV BOLUS
1000.0000 mL | Freq: Once | INTRAVENOUS | Status: AC
  Administered 2020-01-12: 1000 mL via INTRAVENOUS

## 2020-01-12 MED ORDER — LORAZEPAM 1 MG PO TABS: 0.0000 mg | ORAL_TABLET | Freq: Four times a day (QID) | ORAL | Status: DC

## 2020-01-12 MED ORDER — VENLAFAXINE HCL ER 75 MG PO CP24
300.0000 mg | ORAL_CAPSULE | Freq: Every day | ORAL | Status: DC
  Administered 2020-01-12: 300 mg via ORAL
  Filled 2020-01-12: qty 4

## 2020-01-12 MED ORDER — ASPIRIN EC 81 MG PO TBEC
81.0000 mg | DELAYED_RELEASE_TABLET | Freq: Every day | ORAL | Status: DC
Start: 2020-01-12 — End: 2020-01-12
  Administered 2020-01-12: 81 mg via ORAL
  Filled 2020-01-12: qty 1

## 2020-01-12 MED ORDER — ADULT MULTIVITAMIN W/MINERALS CH
1.0000 | ORAL_TABLET | Freq: Every day | ORAL | Status: DC
  Administered 2020-01-12: 1 via ORAL
  Filled 2020-01-12: qty 1
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
## 2020-01-12 NOTE — ED Provider Notes (Signed)
Patient has been seen by NP [facility] psychiatric team.  She is not homicidal or suicidal.  There is no evidence of life-threatening alcohol withdrawal.  She denies having thoughts about herself or anyone else.  Psychiatry recommendations are to rescind IVC and discharged with outpatient follow-up.  Patient comfortable with this plan. CIWA is 1. No indication of life threatening alcohol withdrawal.    Ezequiel Essex, MD 01/12/20 1443

## 2020-01-12 NOTE — Progress Notes (Signed)
..   Transition of Care [facility]) - Emergency Department Mini Assessment   Patient Details  Name: Saron Vanorman MRN: [id number] Date of Birth: 24-Sep-1960  Transition [facility] Care [facility]) CM/SW Contact:    Charlsey Moragne C Tarpley-Carter, [place] Phone Number: 01/12/2020, 2:36 PM   Clinical Narrative: [facility] CM/CSW spoke with pt in regards to her substance use.  Pt states she receives assistance from a treatment center currently, but would like more assistance.  CSW provided pt with resources.  Markeeta Scalf Tarpley-Carter, MSW, LCSW-A Pronouns:  She, Her, Hers                  [facility] ED Transitions of CareClinical Social Worker [email or web address] [phone]   ED Mini Assessment:    Barriers to Discharge: No Barriers Identified     Means of departure: Police  Interventions which prevented an admission or readmission: Other (must enter comment) (Substance Use)    Patient Contact and Communications     Spoke with: Genevie Ann Contact Date: 01/12/20,   Contact time: 0150   Call outcome: Pt attends a treatment program.  Pt will accept resources for assistance.      Choice offered to / list presented to : Patient  Admission diagnosis:  IVC [place]  Patient Active Problem List   Diagnosis Date Noted  . Aggressive behavior 01/11/2020  . Diplopia 05/03/2019  . Generalized anxiety disorder 09/19/2013  . Panic attacks 09/19/2013  . Homicidal ideation 09/18/2013  . Alcohol abuse 09/17/2013  . S/P alcohol detoxification 09/17/2013   PCP:  Lawerance Cruel, MD Pharmacy:   [facility] [address] [place] [address] Phone: [phone] Fax: [phone]

## 2020-01-12 NOTE — ED Notes (Signed)
Patient is upset and asking about discharge. Explained to patient that IVC has to be rescinded first. Will discuss with doctor.

## 2020-01-12 NOTE — ED Provider Notes (Signed)
Patient signed out to me at shift change pending repeat BMP after fluids.  Will need TTS consult after medically clear.  5:37 AM Patient is now medically clear.  Ready for TTS eval.   Montine Circle, PA-C 01/12/20 0537    Shanon Rosser, MD 01/12/20 [phone]

## 2020-01-12 NOTE — ED Provider Notes (Signed)
IVC rescinded after review of [facility] notes, discussion with Dr. Wyvonnia Dusky and patient.   Pattricia Boss, MD 01/12/20 [phone]

## 2020-04-21 ENCOUNTER — Other Ambulatory Visit: Payer: Self-pay | Admitting: Cardiovascular Disease

## 2020-04-21 DIAGNOSIS — Z0000 Encounter for general adult medical examination without abnormal findings: Secondary | ICD-10-CM

## 2020-05-20 ENCOUNTER — Emergency Department (HOSPITAL_COMMUNITY)
Admission: EM | Admit: 2020-05-20 | Discharge: 2020-05-20 | Disposition: A | Discharge status: Home / Self Care | Payer: 59 | Attending: Emergency Medicine | Admitting: Emergency Medicine

## 2020-05-20 ENCOUNTER — Encounter (HOSPITAL_COMMUNITY): Payer: Self-pay

## 2020-05-20 ENCOUNTER — Emergency Department (HOSPITAL_COMMUNITY): Payer: 59

## 2020-05-20 ENCOUNTER — Other Ambulatory Visit: Payer: Self-pay

## 2020-05-20 DIAGNOSIS — Z7984 Long term (current) use of oral hypoglycemic drugs: Secondary | ICD-10-CM | POA: Diagnosis not present

## 2020-05-20 DIAGNOSIS — E119 Type 2 diabetes mellitus without complications: Secondary | ICD-10-CM | POA: Insufficient documentation

## 2020-05-20 DIAGNOSIS — R1013 Epigastric pain: Secondary | ICD-10-CM | POA: Diagnosis present

## 2020-05-20 DIAGNOSIS — Z87891 Personal history of nicotine dependence: Secondary | ICD-10-CM | POA: Diagnosis not present

## 2020-05-20 DIAGNOSIS — Z853 Personal history of malignant neoplasm of breast: Secondary | ICD-10-CM | POA: Insufficient documentation

## 2020-05-20 DIAGNOSIS — K2900 Acute gastritis without bleeding: Secondary | ICD-10-CM | POA: Insufficient documentation

## 2020-05-20 DIAGNOSIS — Z7982 Long term (current) use of aspirin: Secondary | ICD-10-CM | POA: Diagnosis not present

## 2020-05-20 LAB — CBC WITH DIFFERENTIAL/PLATELET
Abs Immature Granulocytes: 0.01 10*3/uL (ref 0.00–0.07)
Basophils Absolute: 0 10*3/uL (ref 0.0–0.1)
Basophils Relative: 1 %
Eosinophils Absolute: 0 10*3/uL (ref 0.0–0.5)
Eosinophils Relative: 1 %
HCT: 34.9 % — ABNORMAL LOW (ref 36.0–46.0)
Hemoglobin: 11.2 g/dL — ABNORMAL LOW (ref 12.0–15.0)
Immature Granulocytes: 0 %
Lymphocytes Relative: 29 %
Lymphs Abs: 1.7 10*3/uL (ref 0.7–4.0)
MCH: 27.3 pg (ref 26.0–34.0)
MCHC: 32.1 g/dL (ref 30.0–36.0)
MCV: 84.9 fL (ref 80.0–100.0)
Monocytes Absolute: 0.5 10*3/uL (ref 0.1–1.0)
Monocytes Relative: 8 %
Neutro Abs: 3.8 10*3/uL (ref 1.7–7.7)
Neutrophils Relative %: 61 %
Platelets: 303 10*3/uL (ref 150–400)
RBC: 4.11 MIL/uL (ref 3.87–5.11)
RDW: 16.3 % — ABNORMAL HIGH (ref 11.5–15.5)
WBC: 6.1 10*3/uL (ref 4.0–10.5)
nRBC: 0.5 % — ABNORMAL HIGH (ref 0.0–0.2)

## 2020-05-20 LAB — COMPREHENSIVE METABOLIC PANEL
ALT: 37 U/L (ref 0–44)
AST: 32 U/L (ref 15–41)
Albumin: 3.9 g/dL (ref 3.5–5.0)
Alkaline Phosphatase: 88 U/L (ref 38–126)
Anion gap: 10 (ref 5–15)
BUN: 11 mg/dL (ref 6–20)
CO2: 24 mmol/L (ref 22–32)
Calcium: 8.7 mg/dL — ABNORMAL LOW (ref 8.9–10.3)
Chloride: 105 mmol/L (ref 98–111)
Creatinine, Ser: 0.7 mg/dL (ref 0.44–1.00)
GFR, Estimated: >60 mL/min (ref >60)
Glucose, Bld: 103 mg/dL — ABNORMAL HIGH (ref 70–99)
Potassium: 4.1 mmol/L (ref 3.5–5.1)
Sodium: 139 mmol/L (ref 135–145)
Total Bilirubin: 0.5 mg/dL (ref 0.3–1.2)
Total Protein: 7.4 g/dL (ref 6.5–8.1)

## 2020-05-20 LAB — APTT: aPTT: 26 seconds (ref 24–36)

## 2020-05-20 LAB — LACTIC ACID, PLASMA
Lactic Acid, Venous: 2.3 mmol/L (ref 0.5–1.9)
Lactic Acid, Venous: 2 mmol/L (ref 0.5–1.9)

## 2020-05-20 LAB — PROTIME-INR
INR: 0.9 (ref 0.8–1.2)
Prothrombin Time: 12.2 seconds (ref 11.4–15.2)

## 2020-05-20 LAB — LIPASE, BLOOD: Lipase: 26 U/L (ref 11–51)

## 2020-05-20 LAB — ACETAMINOPHEN LEVEL: Acetaminophen (Tylenol), Serum: <10 ug/mL — ABNORMAL LOW (ref 10–30)

## 2020-05-20 LAB — ETHANOL: Alcohol, Ethyl (B): <10 mg/dL (ref <10)

## 2020-05-20 MED ORDER — IOHEXOL 300 MG/ML  SOLN
100.0000 mL | Freq: Once | INTRAMUSCULAR | Status: AC | PRN
  Administered 2020-05-20: 100 mL via INTRAVENOUS

## 2020-05-20 MED ORDER — SODIUM CHLORIDE 0.9 % IV BOLUS: 1000.0000 mL | Freq: Once | INTRAVENOUS | Status: DC

## 2020-05-20 MED ORDER — ALUM & MAG HYDROXIDE-SIMETH 200-200-20 MG/5ML PO SUSP
30.0000 mL | Freq: Once | ORAL | Status: AC
  Administered 2020-05-20: 30 mL via ORAL
  Filled 2020-05-20: qty 30

## 2020-05-20 MED ORDER — HYDROMORPHONE HCL 1 MG/ML IJ SOLN
1.0000 mg | Freq: Once | INTRAMUSCULAR | Status: AC
  Administered 2020-05-20: 1 mg via INTRAVENOUS
  Filled 2020-05-20: qty 1

## 2020-05-20 MED ORDER — FENTANYL CITRATE (PF) 100 MCG/2ML IJ SOLN
50.0000 ug | Freq: Once | INTRAMUSCULAR | Status: AC
Start: 2020-05-20 — End: 2020-05-20
  Administered 2020-05-20: 50 ug via INTRAMUSCULAR

## 2020-05-20 MED ORDER — LACTATED RINGERS IV BOLUS (SEPSIS)
1000.0000 mL | Freq: Once | INTRAVENOUS | Status: AC
  Administered 2020-05-20: 1000 mL via INTRAVENOUS

## 2020-05-20 MED ORDER — LACTATED RINGERS IV BOLUS
1000.0000 mL | Freq: Once | INTRAVENOUS | Status: AC
  Administered 2020-05-20: 1000 mL via INTRAVENOUS

## 2020-05-20 MED ORDER — HYDROMORPHONE HCL 1 MG/ML IJ SOLN
0.5000 mg | Freq: Once | INTRAMUSCULAR | Status: AC
Start: 2020-05-20 — End: 2020-05-20
  Administered 2020-05-20: 0.5 mg via INTRAVENOUS
  Filled 2020-05-20: qty 1

## 2020-05-20 MED ORDER — SUCRALFATE 1 GM/10ML PO SUSP: 1.0000 g | Freq: Two times a day (BID) | ORAL | 0 refills | Status: DC | PRN

## 2020-05-20 MED ORDER — FENTANYL CITRATE (PF) 100 MCG/2ML IJ SOLN
50.0000 ug | Freq: Once | INTRAMUSCULAR | Status: DC
  Filled 2020-05-20: qty 2

## 2020-05-20 MED ORDER — LIDOCAINE VISCOUS HCL 2 % MT SOLN
15.0000 mL | Freq: Once | OROMUCOSAL | Status: AC
  Administered 2020-05-20: 15 mL via ORAL
  Filled 2020-05-20: qty 15

## 2020-05-20 MED ORDER — PANTOPRAZOLE SODIUM 20 MG PO TBEC: 40.0000 mg | DELAYED_RELEASE_TABLET | Freq: Two times a day (BID) | ORAL | 0 refills | Status: DC

## 2020-05-20 NOTE — ED Notes (Signed)
Pt refused ekg, and vitals I was told per pt none one is touching me until I get some medicine. Nurse notified.

## 2020-05-20 NOTE — ED Notes (Signed)
PA notified that attempted IV and unsuccessful and requested IM fentanyl until IV obtained. Patient is refusing EKG until she receives pain medication. IV team consult placed.

## 2020-05-20 NOTE — ED Triage Notes (Signed)
Coming from home, increasing abd pain since Monday, drank a fifth of tequila on sun, has history of going on drinking bdnders, took goody powder mon night, 10/10 pain, history of gastric bypass, hernia, left arm is restricted, denies vomiting, denies diarrhea

## 2020-05-20 NOTE — ED Notes (Signed)
lactic acid 2.3 report to Emeline Darling

## 2020-05-20 NOTE — ED Provider Notes (Signed)
[facility] DEPT Provider Note   CSN: [id number] Arrival date & time: 05/20/20  1914     History Chief Complaint  Patient presents with  . Abdominal Pain    Sheryl Landry is a 60 y.o. female who presents with concern for severe epigastric and upper abdominal pain since Monday, gradually worsening.  She describes pain as sharp and is currently writhing around in her bed.  She denies any nausea, vomiting, diarrhea.  She states most recent bowel movement was yesterday and was normal for her, denies melena or hematochezia.  Denies any dysuria, hematuria, urinary frequency or urgency.  Denies any vaginal discharge or bleeding.  She has history of hysterectomy.  [name] is history of similar pain in the past but is unable to identify its cause.  According patient's chart has history of alcohol abuse.  Triage note with collateral information from patient's son who states that she drank 1/5 of tequila on Sunday and has a history of going on alcoholic binges.  She does endorse taking Goody powders on Monday night without relief.  She endorses 10/10 pain.  She does have history of gastric bypass and hernia.  She denies any fevers or chills at home.  I personally reviewed this patient medical history she has type 2 diabetes without complication, history of breast cancer and gastric bypass.  Additionally is history of alcohol abuse and homicidal ideation.  Patient underwent EGD in 03/2018.  HPI     Past Medical History:  Diagnosis Date  . Cancer ([place])    breast  . Diabetes mellitus without complication ([place])   . Dysplasia of eye     Patient Active Problem List   Diagnosis Date Noted  . Aggressive behavior 01/11/2020  . Diplopia 05/03/2019  . Generalized anxiety disorder 09/19/2013  . Panic attacks 09/19/2013  . Homicidal ideation 09/18/2013  . Alcohol abuse 09/17/2013  . S/P alcohol detoxification 09/17/2013    Past Surgical History:   Procedure Laterality Date  . ABDOMINAL HYSTERECTOMY    . BIOPSY  03/12/2018   Procedure: BIOPSY;  Surgeon: Alphonsa Overall, MD;  Location: [facility];  Service: General;;  . ESOPHAGOGASTRODUODENOSCOPY (EGD) WITH PROPOFOL N/A 03/12/2018   Procedure: ESOPHAGOGASTRODUODENOSCOPY (EGD) WITH PROPOFOL;  Surgeon: Alphonsa Overall, MD;  Location: [facility];  Service: General;  Laterality: N/A;  . GASTRIC BYPASS    . MASTECTOMY Left    s/p chemo x 8     OB History   No obstetric history on file.     Family History  Problem Relation Age of Onset  . Diabetes Father   . Heart disease Father   . Dementia Father   . Diabetes type II Mother   . Hypertension Mother   . Stroke Mother     Social History   Tobacco Use  . Smoking status: Former Smoker    Types: Cigarettes    Quit date: 12/03/2018    Years since quitting: 1.4  . Smokeless tobacco: Never Used  . Tobacco comment: 2-3 cigarettes per day.  Vaping Use  . Vaping Use: Never used  Substance Use Topics  . Alcohol use: No    Alcohol/week: 0.0 standard drinks  . Drug use: No    Home Medications Prior to Admission medications   Medication Sig Start Date End Date Taking? Authorizing Provider  aspirin 81 MG EC tablet Take 81 mg by mouth daily.   Yes [provider]  Aspirin-Caffeine (BC FAST PAIN RELIEF) 845-65 MG PACK Take  1 Package by mouth daily as needed (pain).   Yes [provider]  buPROPion (WELLBUTRIN XL) 300 MG 24 hr tablet Take 300 mg by mouth every morning. 04/11/19  Yes [provider]  Cyanocobalamin (VITAMIN B 12) 500 MCG TABS Take 500 mcg by mouth daily.   Yes [provider]  metFORMIN (GLUCOPHAGE) 500 MG tablet Take 1 tablet (500 mg total) by mouth daily with breakfast. For diabetes management Patient taking differently: Take 1,000 mg by mouth every evening. 09/23/13  Yes Nwoko, Herbert Pun I, NP  Multiple Vitamin (MULTIVITAMIN WITH MINERALS) TABS tablet Take 1 tablet by mouth every  evening.    Yes [provider]  pantoprazole (PROTONIX) 20 MG tablet Take 2 tablets (40 mg total) by mouth 2 (two) times daily. 05/20/20 06/19/20 Yes Skylin Kennerson, Eugene Garnet R, PA-C  prednisoLONE acetate (PRED FORTE) 1 % ophthalmic suspension Place 1 drop into both eyes in the morning and at bedtime. 03/24/20  Yes [provider]  QUEtiapine (SEROQUEL) 400 MG tablet Take 400 mg by mouth at bedtime.   Yes [provider]  sucralfate (CARAFATE) 1 GM/10ML suspension Take 10 mLs (1 g total) by mouth 2 (two) times daily as needed (epigastric pain). 05/20/20  Yes Tabius Rood, Eugene Garnet R, PA-C  venlafaxine XR (EFFEXOR-XR) 150 MG 24 hr capsule Take 300 mg by mouth daily. 01/04/20  Yes [provider]  ALPRAZolam Duanne Moron) 1 MG tablet Take 1-2 tablets thirty minutes prior to MRI.  May take one additional tablet before entering scanner, if needed.  MUST HAVE DRIVER. 4/0/98   Marcial Pacas, MD    Allergies    Nsaids and Cortisone  Review of Systems   Review of Systems  Constitutional: Positive for appetite change. Negative for activity change, chills, diaphoresis, fatigue and fever.  HENT: Negative.   Respiratory: Negative.   Cardiovascular: Negative.   Gastrointestinal: Positive for abdominal distention and abdominal pain. Negative for anal bleeding, blood in stool, constipation, diarrhea, nausea, rectal pain and vomiting.  Genitourinary: Negative.  Negative for decreased urine volume, dysuria, frequency, hematuria, urgency, vaginal bleeding, vaginal discharge and vaginal pain.  Musculoskeletal: Negative.   Skin: Negative.   Neurological: Negative.   Psychiatric/Behavioral: Negative.     Physical Exam Updated Vital Signs BP (!) 139/101   Pulse 93   Temp 98.1 F (36.7 C)   Resp 20   Ht 5\' 3"  (1.6 m)   Wt 74 kg   SpO2 100%   BMI 28.90 kg/m   Physical Exam Vitals and nursing note reviewed.  Constitutional:      Appearance: She is overweight. She is not  toxic-appearing.     Comments: Patient visibly uncomfortable in the room, writhing around the bed crying out in pain.   HENT:     Head: Normocephalic and atraumatic.     Nose: Nose normal.     Mouth/Throat:     Mouth: Mucous membranes are moist.     Pharynx: Oropharynx is clear. Uvula midline. No oropharyngeal exudate, posterior oropharyngeal erythema or uvula swelling.     Tonsils: No tonsillar exudate.  Eyes:     General: Lids are normal. Vision grossly intact.        Right eye: No discharge.        Left eye: No discharge.     Extraocular Movements: Extraocular movements intact.     Conjunctiva/sclera: Conjunctivae normal.     Pupils: Pupils are equal, round, and reactive to light.  Neck:     Trachea: Trachea and  phonation normal.  Cardiovascular:     Rate and Rhythm: Normal rate and regular rhythm.     Pulses: Normal pulses.     Heart sounds: Normal heart sounds. No murmur heard.   Pulmonary:     Effort: Pulmonary effort is normal. No tachypnea, bradypnea, accessory muscle usage, prolonged expiration or respiratory distress.     Breath sounds: Normal breath sounds. No wheezing or rales.  Chest:     Chest wall: No mass, lacerations, deformity, swelling, tenderness, crepitus or edema.  Abdominal:     General: Bowel sounds are normal. There is no distension.     Palpations: Abdomen is soft.     Tenderness: There is generalized abdominal tenderness and tenderness in the right upper quadrant, epigastric area and left upper quadrant. There is guarding. There is no right CVA tenderness, left CVA tenderness or rebound.  Musculoskeletal:        General: No deformity.     Cervical back: Normal range of motion and neck supple. No rigidity or crepitus. No pain with movement, spinous process tenderness or muscular tenderness.     Right lower leg: No edema.     Left lower leg: No edema.  Lymphadenopathy:     Cervical: No cervical adenopathy.  Skin:    General: Skin is warm and dry.   Neurological:     Mental Status: She is alert and oriented to person, place, and time. Mental status is at baseline.     Sensory: Sensation is intact.     Motor: Motor function is intact.  Psychiatric:        Mood and Affect: Mood normal.     ED Results / Procedures / Treatments   Labs (all labs ordered are listed, but only abnormal results are displayed) Labs Reviewed  COMPREHENSIVE METABOLIC PANEL - Abnormal; Notable for the following components:      Result Value   Glucose, Bld 103 (*)    Calcium 8.7 (*)    All other components within normal limits  LACTIC ACID, PLASMA - Abnormal; Notable for the following components:   Lactic Acid, Venous 2.3 (*)    All other components within normal limits  LACTIC ACID, PLASMA - Abnormal; Notable for the following components:   Lactic Acid, Venous 2.0 (*)    All other components within normal limits  CBC WITH DIFFERENTIAL/PLATELET - Abnormal; Notable for the following components:   Hemoglobin 11.2 (*)    HCT 34.9 (*)    RDW 16.3 (*)    nRBC 0.5 (*)    All other components within normal limits  ACETAMINOPHEN LEVEL - Abnormal; Notable for the following components:   Acetaminophen (Tylenol), Serum <10 (*)    All other components within normal limits  URINE CULTURE  CULTURE, BLOOD (SINGLE)  ETHANOL  LIPASE, BLOOD  PROTIME-INR  APTT  URINALYSIS, ROUTINE W REFLEX MICROSCOPIC  RAPID URINE DRUG SCREEN, HOSP PERFORMED    EKG None  Radiology CT Abdomen Pelvis W Contrast  Result Date: 05/20/2020 CLINICAL DATA:  Increasing epigastric pain over the last 48 hours. Personal history of gastric bypass surgery. EXAM: CT ABDOMEN AND PELVIS WITH CONTRAST TECHNIQUE: Multidetector CT imaging of the abdomen and pelvis was performed using the standard protocol following bolus administration of intravenous contrast. CONTRAST:  120mL OMNIPAQUE IOHEXOL 300 MG/ML  SOLN COMPARISON:  CT of the abdomen and pelvis 04/30/2018 FINDINGS: Lower chest: Minimal  dependent atelectasis is present. Heart size is normal. No significant pleural or pericardial effusion is present. Left mastectomy  and breast implant noted. Hepatobiliary: The dilation of the common bile duct is stable and within normal limits following cholecystectomy. Liver is unremarkable. Pancreas: Unremarkable. No pancreatic ductal dilatation or surrounding inflammatory changes. Spleen: Choose Adrenals/Urinary Tract: The adrenal glands are within limits. Benign 12 mm exophytic cyst is noted in the upper pole of the left kidney. Calcified lesion anteriorly at the upper pole of left kidney stable additional renal lesions are present. Ureters are within normal limits. No obstruction is present. The urinary bladder is within normal limits. Stomach/Bowel: Partial gastrectomy noted. Diffuse mucosal thickening is present throughout the stomach. There is some edema about the duodenal bulb. No ulceration or significant adjacent inflammatory change is evident. Small bowel fall is unremarkable ileum is normal. Appendix is not visualized likely is surgically absent. The ascending and transverse colon are normal. The descending and sigmoid colon are normal. Vascular/Lymphatic: Minimal atherosclerotic changes are noted, namely at the origin of the inferior mesenteric artery. Reproductive: Status post hysterectomy. No adnexal masses. Other: No abdominal wall hernia or abnormality. No abdominopelvic ascites. Musculoskeletal: Degenerative facet changes are noted at L4-5 and L5-S1 bilaterally. Degenerative facet changes foraminal narrowing is present on left at L3-4. Vertebral body heights and alignment are normal. The focal sclerosis anteriorly at T9 is stable, likely bone island. Bony pelvis is within normal limits. The hips are located and normal. IMPRESSION: 1. Diffuse mucosal thickening throughout the stomach and duodenal bulb compatible with a nonspecific gastritis/duodenitis. 2. Partial gastrectomy 3. No ulceration or  significant adjacent inflammatory change. 4. Cholecystectomy and hysterectomy. 5. Degenerative changes of the lumbar spine are most evident at L4-5 and L5-S1. Electronically Signed   By: San Morelle M.D.   On: 05/20/2020 13:41    Procedures Procedures  Medications Ordered in ED Medications  fentaNYL (SUBLIMAZE) injection 50 mcg (50 mcg Intramuscular Given 05/20/20 1055)  lactated ringers bolus 1,000 mL (0 mLs Intravenous Stopped 05/20/20 1228)  HYDROmorphone (DILAUDID) injection 0.5 mg (0.5 mg Intravenous Given 05/20/20 1158)  lactated ringers bolus 1,000 mL (0 mLs Intravenous Stopped 05/20/20 1405)  iohexol (OMNIPAQUE) 300 MG/ML solution 100 mL (100 mLs Intravenous Contrast Given 05/20/20 1248)  HYDROmorphone (DILAUDID) injection 1 mg (1 mg Intravenous Given 05/20/20 1327)  alum & mag hydroxide-simeth (MAALOX/MYLANTA) 200-200-20 MG/5ML suspension 30 mL (30 mLs Oral Given 05/20/20 1438)    And  lidocaine (XYLOCAINE) 2 % viscous mouth solution 15 mL (15 mLs Oral Given 05/20/20 1439)    ED Course  I have reviewed the triage vital signs and the nursing notes.  Pertinent labs & imaging results that were available during my care of the patient were reviewed by me and considered in my medical decision making (see chart for details).  Clinical Course as of 05/20/20 1457  Wed May 20, 2020  1131 Patient a difficult stick, IV team consult placed to secure line. Labs collected off of straight stick.  [RS]    Clinical Course User Index [RS] Pearly Bartosik, Sharlene Dory   MDM Rules/Calculators/A&P                         60 year old female presents with concern for epigastric and upper abdominal pain. Hx of alcohol abuse.  Differential diagnosis for this patient was includes but is not amended to GERD, gastritis, cryptitis, small bowel obstruction, mesenteric ischemia, cholecystitis, biliary colic, cholangitis, choledocholithiasis.   Hypertensive on intake, VS otherwise normal. Cardiopulmonary  exam is normal, abdominal exam revealed generalized abdominal tenderness to palpation significantly in  the epigastrium and right upper quadrant.  CBC with 11.2, at baseline. CMP unremarkable. Lipase is normal, alcohol level <10. Lactic acid elevated to 2.3, IV fluids ordered. Analgesia ordered.   Repeat lactic acid improved to 2, patient administer second liter bolus.  CT of the abdomen pelvis reassuring, but did reveal some gastritis and duodenitis without other focal abnormality.  No further work-up is warranted in ED at this time.  Will administer GI cocktail and discharge with course of high-dose pantoprazole and PRN carafate.  Recommend close GI follow-up.  Patient reevaluated with improvement in her symptoms after administration of IV pain medication.  Tolerated GI cocktail well.  Shanquita voiced understanding of her medical evaluation and treatment plan.  Each of her questions was answered to her expressed satisfaction.  Return precautions given.  Patient is well-appearing, stable, and appropriate for discharge.  This chart was dictated using voice recognition software, Dragon. Despite the best efforts of this provider to proofread and correct errors, errors may still occur which can change documentation meaning.  Final Clinical Impression(s) / ED Diagnoses Final diagnoses:  Acute gastritis, presence of bleeding unspecified, unspecified gastritis type    Rx / DC Orders ED Discharge Orders         Ordered    pantoprazole (PROTONIX) 20 MG tablet  2 times daily        05/20/20 1436    sucralfate (CARAFATE) 1 GM/10ML suspension  2 times daily PRN        05/20/20 1436           Yeraldy Spike, Sharlene Dory 05/20/20 1458    Luna Fuse, MD 05/22/20 [phone]

## 2020-05-20 NOTE — ED Notes (Signed)
Patient gone for testing. Unable to update vitals.

## 2020-05-20 NOTE — ED Notes (Signed)
PT is aware that urine sample is needed.

## 2020-05-20 NOTE — ED Notes (Signed)
Attempted to start an [place] IV x2, unsuccessful. Patient advised that she cannot dictate IV placement, and that staff will try our best.

## 2020-05-20 NOTE — Discharge Instructions (Addendum)
You are seen in the emergency room today for your epigastric pain.  Your blood work and vital signs were very reassuring.  Your CT scan revealed inflammation of your stomach and the first portion of your small intestine known as gastritis and duodenitis, likely related to your alcohol intake.  You have been prescribed a medication called pantoprazole (Protonix) (which you should take twice daily for the next month.  Additionally prescribed medication called Carafate which is a liquid medication that you may take twice per day as needed for epigastric burning.  Below is the contact information for Dr. Watt Climes, gastroenterologist. Please call his office this afternoon to schedule an appointment for a follow-up.   Please eat a bland diet and avoid acidic spicy foods, including alcohol.  Return to the emergency department if you develop any chest pain, difficulty breathing, nausea or vomiting that does not stop, or any other new severe symptoms.

## 2020-05-25 LAB — CULTURE, BLOOD (SINGLE): Culture: NO GROWTH

## 2020-06-12 ENCOUNTER — Ambulatory Visit: Payer: 59

## 2020-06-26 ENCOUNTER — Emergency Department (HOSPITAL_COMMUNITY)
Admission: EM | Admit: 2020-06-26 | Discharge: 2020-06-26 | Disposition: A | Discharge status: Home / Self Care | Payer: 59 | Attending: Emergency Medicine | Admitting: Emergency Medicine

## 2020-06-26 ENCOUNTER — Encounter (HOSPITAL_COMMUNITY): Payer: Self-pay | Admitting: Emergency Medicine

## 2020-06-26 DIAGNOSIS — E119 Type 2 diabetes mellitus without complications: Secondary | ICD-10-CM | POA: Diagnosis not present

## 2020-06-26 DIAGNOSIS — Z853 Personal history of malignant neoplasm of breast: Secondary | ICD-10-CM | POA: Insufficient documentation

## 2020-06-26 DIAGNOSIS — R4689 Other symptoms and signs involving appearance and behavior: Secondary | ICD-10-CM | POA: Diagnosis not present

## 2020-06-26 DIAGNOSIS — Z7984 Long term (current) use of oral hypoglycemic drugs: Secondary | ICD-10-CM | POA: Diagnosis not present

## 2020-06-26 DIAGNOSIS — Y907 Blood alcohol level of 200-239 mg/100 ml: Secondary | ICD-10-CM | POA: Insufficient documentation

## 2020-06-26 DIAGNOSIS — Z7982 Long term (current) use of aspirin: Secondary | ICD-10-CM | POA: Diagnosis not present

## 2020-06-26 DIAGNOSIS — R4182 Altered mental status, unspecified: Secondary | ICD-10-CM | POA: Diagnosis present

## 2020-06-26 DIAGNOSIS — Z87891 Personal history of nicotine dependence: Secondary | ICD-10-CM | POA: Diagnosis not present

## 2020-06-26 LAB — CBC WITH DIFFERENTIAL/PLATELET
Abs Immature Granulocytes: 0.02 10*3/uL (ref 0.00–0.07)
Basophils Absolute: 0 10*3/uL (ref 0.0–0.1)
Basophils Relative: 1 %
Eosinophils Absolute: 0.1 10*3/uL (ref 0.0–0.5)
Eosinophils Relative: 1 %
HCT: 43.1 % (ref 36.0–46.0)
Hemoglobin: 13.1 g/dL (ref 12.0–15.0)
Immature Granulocytes: 0 %
Lymphocytes Relative: 36 %
Lymphs Abs: 2.8 10*3/uL (ref 0.7–4.0)
MCH: 26.5 pg (ref 26.0–34.0)
MCHC: 30.4 g/dL (ref 30.0–36.0)
MCV: 87.1 fL (ref 80.0–100.0)
Monocytes Absolute: 0.4 10*3/uL (ref 0.1–1.0)
Monocytes Relative: 6 %
Neutro Abs: 4.5 10*3/uL (ref 1.7–7.7)
Neutrophils Relative %: 56 %
Platelets: 376 10*3/uL (ref 150–400)
RBC: 4.95 MIL/uL (ref 3.87–5.11)
RDW: 15.6 % — ABNORMAL HIGH (ref 11.5–15.5)
WBC: 7.9 10*3/uL (ref 4.0–10.5)
nRBC: 0 % (ref 0.0–0.2)

## 2020-06-26 LAB — ETHANOL: Alcohol, Ethyl (B): 239 mg/dL — ABNORMAL HIGH (ref <10)

## 2020-06-26 LAB — COMPREHENSIVE METABOLIC PANEL
ALT: 40 U/L (ref 0–44)
AST: 45 U/L — ABNORMAL HIGH (ref 15–41)
Albumin: 4 g/dL (ref 3.5–5.0)
Alkaline Phosphatase: 103 U/L (ref 38–126)
Anion gap: 16 — ABNORMAL HIGH (ref 5–15)
BUN: 13 mg/dL (ref 6–20)
CO2: 24 mmol/L (ref 22–32)
Calcium: 8.7 mg/dL — ABNORMAL LOW (ref 8.9–10.3)
Chloride: 104 mmol/L (ref 98–111)
Creatinine, Ser: 0.9 mg/dL (ref 0.44–1.00)
GFR, Estimated: >60 mL/min (ref >60)
Glucose, Bld: 60 mg/dL — ABNORMAL LOW (ref 70–99)
Potassium: 3.7 mmol/L (ref 3.5–5.1)
Sodium: 144 mmol/L (ref 135–145)
Total Bilirubin: 0.1 mg/dL — ABNORMAL LOW (ref 0.3–1.2)
Total Protein: 8 g/dL (ref 6.5–8.1)

## 2020-06-26 MED ORDER — LORAZEPAM 2 MG/ML IJ SOLN
2.0000 mg | Freq: Once | INTRAMUSCULAR | Status: DC
  Filled 2020-06-26: qty 1

## 2020-06-26 NOTE — ED Notes (Signed)
Per MD, pt does not meet IVC criteria. Pt requesting to leave at this time. Pt refusing to stay and unable to be redirected. Pt verbally aggressive towards staff and states "I can call my own taxi and find my own way home." Pt did not arrive to ED with any belongings or shoes, only a shirt and pants. Security at bedside to escort the pt out of the department.

## 2020-06-26 NOTE — ED Notes (Signed)
Went in to complete patients EKG. Patient was cussing and said she did not need an EKG and that she wanted to leave. The RN was calmly explaining we were just trying to make sure she was okay.
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
## 2020-06-26 NOTE — ED Notes (Addendum)
After RN obtained lab draw, RN/NT attempted to obtain EKG and COVID swab, but pt refused. Pt became verbally aggressive and began raising her voice at staff. Pt unwilling to allow staff to explain anything to her. Pt unable to be redirected. Pt currently not under IVC, therefore unable to medicate pt at this time. MD Joya Gaskins made aware and at bedside.
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
## 2020-06-29 ENCOUNTER — Emergency Department (HOSPITAL_COMMUNITY)
Admission: EM | Admit: 2020-06-29 | Discharge: 2020-06-30 | Disposition: A | Discharge status: Home / Self Care | Payer: 59 | Attending: Emergency Medicine | Admitting: Emergency Medicine

## 2020-06-29 DIAGNOSIS — Z046 Encounter for general psychiatric examination, requested by authority: Secondary | ICD-10-CM | POA: Insufficient documentation

## 2020-06-29 DIAGNOSIS — E119 Type 2 diabetes mellitus without complications: Secondary | ICD-10-CM | POA: Insufficient documentation

## 2020-06-29 DIAGNOSIS — Z87891 Personal history of nicotine dependence: Secondary | ICD-10-CM | POA: Diagnosis not present

## 2020-06-29 DIAGNOSIS — R456 Violent behavior: Secondary | ICD-10-CM | POA: Diagnosis not present

## 2020-06-29 DIAGNOSIS — Z7982 Long term (current) use of aspirin: Secondary | ICD-10-CM | POA: Diagnosis not present

## 2020-06-29 DIAGNOSIS — Y906 Blood alcohol level of 120-199 mg/100 ml: Secondary | ICD-10-CM | POA: Diagnosis not present

## 2020-06-29 DIAGNOSIS — Z20822 Contact with and (suspected) exposure to covid-19: Secondary | ICD-10-CM | POA: Diagnosis not present

## 2020-06-29 DIAGNOSIS — Z7984 Long term (current) use of oral hypoglycemic drugs: Secondary | ICD-10-CM | POA: Diagnosis not present

## 2020-06-29 DIAGNOSIS — Z853 Personal history of malignant neoplasm of breast: Secondary | ICD-10-CM | POA: Insufficient documentation

## 2020-06-29 DIAGNOSIS — F1010 Alcohol abuse, uncomplicated: Secondary | ICD-10-CM

## 2020-06-29 DIAGNOSIS — R4689 Other symptoms and signs involving appearance and behavior: Secondary | ICD-10-CM

## 2020-06-29 LAB — COMPREHENSIVE METABOLIC PANEL
ALT: 33 U/L (ref 0–44)
AST: 32 U/L (ref 15–41)
Albumin: 3.4 g/dL — ABNORMAL LOW (ref 3.5–5.0)
Alkaline Phosphatase: 81 U/L (ref 38–126)
Anion gap: 15 (ref 5–15)
BUN: 11 mg/dL (ref 6–20)
CO2: 21 mmol/L — ABNORMAL LOW (ref 22–32)
Calcium: 8.4 mg/dL — ABNORMAL LOW (ref 8.9–10.3)
Chloride: 101 mmol/L (ref 98–111)
Creatinine, Ser: 0.98 mg/dL (ref 0.44–1.00)
GFR, Estimated: >60 mL/min (ref >60)
Glucose, Bld: 78 mg/dL (ref 70–99)
Potassium: 2.9 mmol/L — ABNORMAL LOW (ref 3.5–5.1)
Sodium: 137 mmol/L (ref 135–145)
Total Bilirubin: 0.3 mg/dL (ref 0.3–1.2)
Total Protein: 6.7 g/dL (ref 6.5–8.1)

## 2020-06-29 LAB — I-STAT BETA HCG BLOOD, ED (MC, WL, AP ONLY): I-stat hCG, quantitative: <5 m[IU]/mL (ref <5)

## 2020-06-29 LAB — CBC WITH DIFFERENTIAL/PLATELET
Abs Immature Granulocytes: 0.02 10*3/uL (ref 0.00–0.07)
Basophils Absolute: 0 10*3/uL (ref 0.0–0.1)
Basophils Relative: 1 %
Eosinophils Absolute: 0.1 10*3/uL (ref 0.0–0.5)
Eosinophils Relative: 2 %
HCT: 34.2 % — ABNORMAL LOW (ref 36.0–46.0)
Hemoglobin: 10.5 g/dL — ABNORMAL LOW (ref 12.0–15.0)
Immature Granulocytes: 0 %
Lymphocytes Relative: 45 %
Lymphs Abs: 3 10*3/uL (ref 0.7–4.0)
MCH: 27 pg (ref 26.0–34.0)
MCHC: 30.7 g/dL (ref 30.0–36.0)
MCV: 87.9 fL (ref 80.0–100.0)
Monocytes Absolute: 0.4 10*3/uL (ref 0.1–1.0)
Monocytes Relative: 7 %
Neutro Abs: 3 10*3/uL (ref 1.7–7.7)
Neutrophils Relative %: 45 %
Platelets: 312 10*3/uL (ref 150–400)
RBC: 3.89 MIL/uL (ref 3.87–5.11)
RDW: 15.3 % (ref 11.5–15.5)
WBC: 6.6 10*3/uL (ref 4.0–10.5)
nRBC: 0 % (ref 0.0–0.2)

## 2020-06-29 LAB — RESP PANEL BY RT-PCR (FLU A&B, COVID) ARPGX2
Influenza A by PCR: NEGATIVE
Influenza B by PCR: NEGATIVE
SARS Coronavirus 2 by RT PCR: NEGATIVE

## 2020-06-29 LAB — ETHANOL: Alcohol, Ethyl (B): 170 mg/dL — ABNORMAL HIGH (ref <10)

## 2020-06-29 MED ORDER — METFORMIN HCL 500 MG PO TABS: 1000.0000 mg | ORAL_TABLET | Freq: Every evening | ORAL | Status: DC

## 2020-06-29 MED ORDER — LORAZEPAM 2 MG/ML IJ SOLN
2.0000 mg | Freq: Once | INTRAMUSCULAR | Status: AC
  Administered 2020-06-29: 2 mg via INTRAMUSCULAR
  Filled 2020-06-29: qty 1

## 2020-06-29 MED ORDER — ZIPRASIDONE MESYLATE 20 MG IM SOLR
20.0000 mg | Freq: Once | INTRAMUSCULAR | Status: AC
  Administered 2020-06-29: 20 mg via INTRAMUSCULAR
  Filled 2020-06-29: qty 20

## 2020-06-29 MED ORDER — STERILE WATER FOR INJECTION IJ SOLN
INTRAMUSCULAR | Status: AC
  Filled 2020-06-29: qty 10

## 2020-06-29 MED ORDER — LORAZEPAM 1 MG PO TABS
0.0000 mg | ORAL_TABLET | Freq: Four times a day (QID) | ORAL | Status: DC
Start: 2020-06-29 — End: 2020-06-30

## 2020-06-29 MED ORDER — LORAZEPAM 2 MG/ML IJ SOLN: 0.0000 mg | Freq: Four times a day (QID) | INTRAMUSCULAR | Status: DC

## 2020-06-29 MED ORDER — LORAZEPAM 1 MG PO TABS: 0.0000 mg | ORAL_TABLET | Freq: Two times a day (BID) | ORAL | Status: DC

## 2020-06-29 MED ORDER — THIAMINE HCL 100 MG PO TABS
100.0000 mg | ORAL_TABLET | Freq: Every day | ORAL | Status: DC
  Administered 2020-06-30: 100 mg via ORAL
  Filled 2020-06-29: qty 1

## 2020-06-29 MED ORDER — THIAMINE HCL 100 MG/ML IJ SOLN: 100.0000 mg | Freq: Every day | INTRAMUSCULAR | Status: DC

## 2020-06-29 MED ORDER — POTASSIUM CHLORIDE CRYS ER 20 MEQ PO TBCR: 40.0000 meq | EXTENDED_RELEASE_TABLET | Freq: Once | ORAL | Status: DC

## 2020-06-29 MED ORDER — LORAZEPAM 2 MG/ML IJ SOLN: 0.0000 mg | Freq: Two times a day (BID) | INTRAMUSCULAR | Status: DC
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
## 2020-06-29 NOTE — ED Notes (Signed)
PO medication currently sedated r/t previous medication administration. PO medication currently held
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
## 2020-06-29 NOTE — ED Notes (Signed)
Pt currently asleep. Respirations even and unlabored
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
## 2020-06-29 NOTE — ED Notes (Signed)
Pt uncooperative with triage question and will not allow staff to get vital signs.

## 2020-06-30 NOTE — ED Notes (Signed)
Pt belongings located in locker 30

## 2020-06-30 NOTE — Discharge Instructions (Signed)
For your behavioral health needs, you are advised to continue treatment with your current outpatient provider. °

## 2020-06-30 NOTE — ED Notes (Signed)
Report given to TCU RN.  

## 2020-06-30 NOTE — ED Notes (Signed)
Pt resting comfortably VSS 

## 2020-06-30 NOTE — ED Notes (Signed)
Per ED tech, pt is not agreeable to changing into maroon scrubs, and the pt is asking to leave. Agricultural consultant notified. Pt to be moved to room 30 in TCU and security called to escort pt to TCU. Pt has been evaluated by behavioral health at this time.
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
## 2020-06-30 NOTE — ED Notes (Signed)
Pt DCd off unit to home per provider. Pt alert, calm, cooperative, no s/s of distress.   DC information given to and reviewed with pt,  Pt acknowledged understanding. Belongings given to pt. Pt ambulatory off unit, escorted by NT. Pt transported by family.

## 2020-06-30 NOTE — BH Assessment (Addendum)
[facility] Assessment Progress Note  Per Thomes Lolling, NP, this pt does not require psychiatric hospitalization at this time.  Pt presents under IVC initiated by pt's spouse and upheld by EDP Lennice Sites, DO, which has been rescinded by EDP Lacretia Leigh, MD.  Pt is psychiatrically cleared.  Discharge instructions advise pt to continue treatment with her current outpatient provider.  Dr Zenia Resides and pt's nurse, Eustaquio Maize, have been notified.  Jalene Mullet, [place] Triage Specialist [phone]

## 2020-08-05 ENCOUNTER — Ambulatory Visit: Payer: 59

## 2020-08-31 HISTORY — PX: COLONOSCOPY WITH ESOPHAGOGASTRODUODENOSCOPY (EGD): SHX5779

## 2020-10-02 DIAGNOSIS — S61216A Laceration without foreign body of right little finger without damage to nail, initial encounter: Secondary | ICD-10-CM | POA: Insufficient documentation

## 2020-10-02 DIAGNOSIS — G5601 Carpal tunnel syndrome, right upper limb: Secondary | ICD-10-CM | POA: Insufficient documentation

## 2020-10-19 DIAGNOSIS — G5621 Lesion of ulnar nerve, right upper limb: Secondary | ICD-10-CM | POA: Insufficient documentation

## 2020-12-03 NOTE — H&P (Signed)
Preoperative History & Physical Exam  Surgeon: Matt Holmes, MD  Diagnosis: Right carpal, cubital, guyons canal compression neuropathy  Planned Procedure: Procedure(s) (LRB): Right carpal tunnel release and cubital tunnel release guyons canal release (Right)  History of Present Illness:   Patient is a 60 y.o. female with symptoms consistent with  Right carpal, cubital, guyons canal compression neuropathy who presents for surgical intervention. The risks, benefits and alternatives of surgical intervention were discussed and informed consent was obtained prior to surgery.  Past Medical History:  Past Medical History:  Diagnosis Date   Cancer ([place])    breast   Diabetes mellitus without complication ([place])    Dysplasia of eye     Past Surgical History:  Past Surgical History:  Procedure Laterality Date   ABDOMINAL HYSTERECTOMY     BIOPSY  03/12/2018   Procedure: BIOPSY;  Surgeon: Alphonsa Overall, MD;  Location: [facility];  Service: General;;   ESOPHAGOGASTRODUODENOSCOPY (EGD) WITH PROPOFOL N/A 03/12/2018   Procedure: ESOPHAGOGASTRODUODENOSCOPY (EGD) WITH PROPOFOL;  Surgeon: Alphonsa Overall, MD;  Location: [facility];  Service: General;  Laterality: N/A;   GASTRIC BYPASS     MASTECTOMY Left    s/p chemo x 8    Medications:  Prior to Admission medications   Medication Sig Start Date End Date Taking? Authorizing Provider  ALPRAZolam Duanne Moron) 1 MG tablet Take 1-2 tablets thirty minutes prior to MRI.  May take one additional tablet before entering scanner, if needed.  MUST HAVE DRIVER. 4/0/98   Marcial Pacas, MD  aspirin 81 MG EC tablet Take 81 mg by mouth daily.    [provider]  Aspirin-Caffeine (BC FAST PAIN RELIEF) 845-65 MG PACK Take 1 Package by mouth daily as needed (pain).    [provider]  buPROPion (WELLBUTRIN XL) 300 MG 24 hr tablet Take 300 mg by mouth every morning. 04/11/19   [provider]  Cyanocobalamin (VITAMIN B 12) 500 MCG TABS Take  500 mcg by mouth daily.    [provider]  metFORMIN (GLUCOPHAGE) 500 MG tablet Take 1 tablet (500 mg total) by mouth daily with breakfast. For diabetes management Patient taking differently: Take 1,000 mg by mouth every evening. 09/23/13   Encarnacion Slates, NP  Multiple Vitamin (MULTIVITAMIN WITH MINERALS) TABS tablet Take 1 tablet by mouth every evening.     [provider]  pantoprazole (PROTONIX) 20 MG tablet Take 2 tablets (40 mg total) by mouth 2 (two) times daily. 05/20/20 06/19/20  Sponseller, Eugene Garnet R, PA-C  prednisoLONE acetate (PRED FORTE) 1 % ophthalmic suspension Place 1 drop into both eyes in the morning and at bedtime. 03/24/20   [provider]  QUEtiapine (SEROQUEL) 400 MG tablet Take 400 mg by mouth at bedtime.    [provider]  sucralfate (CARAFATE) 1 GM/10ML suspension Take 10 mLs (1 g total) by mouth 2 (two) times daily as needed (epigastric pain). 05/20/20   Sponseller, Eugene Garnet R, PA-C  venlafaxine XR (EFFEXOR-XR) 150 MG 24 hr capsule Take 300 mg by mouth daily. 01/04/20   [provider]    Allergies:  Nsaids and Cortisone  Review of Systems: Negative except per HPI.  Physical Exam: Alert and oriented, NAD Head and neck: no masses, normal alignment CV: pulse intact Pulm: no increased work of breathing, respirations even and unlabored Abdomen: non-distended Extremities: extremities warm and well perfused  LABS: No results found for this or any previous visit (from the past 2160 hour(s)).   Complete History and Physical exam  available in the office notes  Orene Desanctis

## 2020-12-15 ENCOUNTER — Encounter (HOSPITAL_BASED_OUTPATIENT_CLINIC_OR_DEPARTMENT_OTHER): Payer: Self-pay | Admitting: Orthopedic Surgery

## 2020-12-15 ENCOUNTER — Other Ambulatory Visit: Payer: Self-pay

## 2020-12-15 NOTE — Progress Notes (Signed)
Spoke w/ via phone for pre-op interview--- pt Lab needs dos----  [employer] results------ current ekg in epic/ chart COVID test -----patient states asymptomatic no test needed Arrive at ------- 1215 on 12-17-2020 NPO after MN NO Solid Food.  Clear liquids from MN until--- 1115 Med rec completed Medications to take morning of surgery ----- wellbutrin Diabetic medication ----- pt takes her metformin at night Patient instructed no nail polish to be worn day of surgery Patient instructed to bring photo id and insurance card day of surgery Patient aware to have Driver (ride ) / caregiver for 24 hours after surgery -- husband, douglas Patient Special Instructions ----- n/a Pre-Op special Istructions ----- pt request for her husband to stay the whole time she is in the facility Patient verbalized understanding of instructions that were given at this phone interview. Patient denies shortness of breath, chest pain, fever, cough at this phone interview.

## 2020-12-17 ENCOUNTER — Other Ambulatory Visit: Payer: Self-pay

## 2020-12-17 ENCOUNTER — Ambulatory Visit (HOSPITAL_BASED_OUTPATIENT_CLINIC_OR_DEPARTMENT_OTHER)
Admission: RE | Admit: 2020-12-17 | Discharge: 2020-12-17 | Disposition: A | Discharge status: Home / Self Care | Payer: 59 | Attending: Orthopedic Surgery | Admitting: Orthopedic Surgery

## 2020-12-17 ENCOUNTER — Encounter (HOSPITAL_BASED_OUTPATIENT_CLINIC_OR_DEPARTMENT_OTHER)
Admission: RE | Disposition: A | Discharge status: Home / Self Care | Payer: Self-pay | Source: Home / Self Care | Attending: Orthopedic Surgery

## 2020-12-17 ENCOUNTER — Encounter (HOSPITAL_BASED_OUTPATIENT_CLINIC_OR_DEPARTMENT_OTHER): Payer: Self-pay | Admitting: Orthopedic Surgery

## 2020-12-17 ENCOUNTER — Ambulatory Visit (HOSPITAL_BASED_OUTPATIENT_CLINIC_OR_DEPARTMENT_OTHER): Payer: 59 | Admitting: Anesthesiology

## 2020-12-17 DIAGNOSIS — E119 Type 2 diabetes mellitus without complications: Secondary | ICD-10-CM | POA: Diagnosis not present

## 2020-12-17 DIAGNOSIS — F419 Anxiety disorder, unspecified: Secondary | ICD-10-CM | POA: Diagnosis not present

## 2020-12-17 DIAGNOSIS — G5621 Lesion of ulnar nerve, right upper limb: Secondary | ICD-10-CM | POA: Diagnosis not present

## 2020-12-17 DIAGNOSIS — Z7984 Long term (current) use of oral hypoglycemic drugs: Secondary | ICD-10-CM | POA: Diagnosis not present

## 2020-12-17 DIAGNOSIS — F32A Depression, unspecified: Secondary | ICD-10-CM | POA: Diagnosis not present

## 2020-12-17 DIAGNOSIS — Z87891 Personal history of nicotine dependence: Secondary | ICD-10-CM | POA: Insufficient documentation

## 2020-12-17 DIAGNOSIS — G5601 Carpal tunnel syndrome, right upper limb: Secondary | ICD-10-CM | POA: Diagnosis present

## 2020-12-17 HISTORY — PX: CARPAL TUNNEL RELEASE: SHX101

## 2020-12-17 HISTORY — DX: Personal history of other specified conditions: Z87.898

## 2020-12-17 HISTORY — DX: Major depressive disorder, single episode, unspecified: F32.9

## 2020-12-17 HISTORY — DX: Type 2 diabetes mellitus without complications: E11.9

## 2020-12-17 HISTORY — DX: Presence of spectacles and contact lenses: Z97.3

## 2020-12-17 HISTORY — DX: Carpal tunnel syndrome, right upper limb: G56.01

## 2020-12-17 LAB — POCT I-STAT, CHEM 8
BUN: 14 mg/dL (ref 6–20)
Calcium, Ion: 1.13 mmol/L — ABNORMAL LOW (ref 1.15–1.40)
Chloride: 107 mmol/L (ref 98–111)
Creatinine, Ser: 0.9 mg/dL (ref 0.44–1.00)
Glucose, Bld: 61 mg/dL — ABNORMAL LOW (ref 70–99)
HCT: 39 % (ref 36.0–46.0)
Hemoglobin: 13.3 g/dL (ref 12.0–15.0)
Potassium: 3.8 mmol/L (ref 3.5–5.1)
Sodium: 143 mmol/L (ref 135–145)
TCO2: 23 mmol/L (ref 22–32)

## 2020-12-17 LAB — GLUCOSE, CAPILLARY: Glucose-Capillary: 75 mg/dL (ref 70–99)

## 2020-12-17 SURGERY — CARPAL TUNNEL RELEASE
Anesthesia: Regional | Laterality: Right

## 2020-12-17 MED ORDER — FENTANYL CITRATE (PF) 100 MCG/2ML IJ SOLN: 25.0000 ug | INTRAMUSCULAR | Status: DC | PRN

## 2020-12-17 MED ORDER — FENTANYL CITRATE (PF) 100 MCG/2ML IJ SOLN
100.0000 ug | Freq: Once | INTRAMUSCULAR | Status: AC
  Administered 2020-12-17: 14:00:00 100 ug via INTRAVENOUS

## 2020-12-17 MED ORDER — CEFAZOLIN SODIUM-DEXTROSE 2-4 GM/100ML-% IV SOLN
INTRAVENOUS | Status: AC
  Filled 2020-12-17: qty 100

## 2020-12-17 MED ORDER — OXYCODONE HCL 5 MG PO TABS: 5.0000 mg | ORAL_TABLET | Freq: Once | ORAL | Status: DC | PRN

## 2020-12-17 MED ORDER — AMISULPRIDE (ANTIEMETIC) 5 MG/2ML IV SOLN: 10.0000 mg | Freq: Once | INTRAVENOUS | Status: DC | PRN

## 2020-12-17 MED ORDER — CEFAZOLIN SODIUM-DEXTROSE 2-4 GM/100ML-% IV SOLN
2.0000 g | INTRAVENOUS | Status: AC
  Administered 2020-12-17: 15:00:00 2 g via INTRAVENOUS

## 2020-12-17 MED ORDER — MIDAZOLAM HCL 2 MG/2ML IJ SOLN
INTRAMUSCULAR | Status: AC
  Filled 2020-12-17: qty 2

## 2020-12-17 MED ORDER — PROPOFOL 500 MG/50ML IV EMUL
INTRAVENOUS | Status: DC | PRN
  Administered 2020-12-17: 100 ug/kg/min via INTRAVENOUS

## 2020-12-17 MED ORDER — PROMETHAZINE HCL 25 MG/ML IJ SOLN: 6.2500 mg | INTRAMUSCULAR | Status: DC | PRN

## 2020-12-17 MED ORDER — PROPOFOL 10 MG/ML IV BOLUS
INTRAVENOUS | Status: DC | PRN
  Administered 2020-12-17: 30 mg via INTRAVENOUS

## 2020-12-17 MED ORDER — HYDROCODONE-ACETAMINOPHEN 5-325 MG PO TABS: 1.0000 | ORAL_TABLET | Freq: Four times a day (QID) | ORAL | 0 refills | Status: AC | PRN

## 2020-12-17 MED ORDER — LIDOCAINE HCL (PF) 1 % IJ SOLN
INTRAMUSCULAR | Status: DC | PRN
  Administered 2020-12-17: 15 mL

## 2020-12-17 MED ORDER — OXYCODONE HCL 5 MG/5ML PO SOLN: 5.0000 mg | Freq: Once | ORAL | Status: DC | PRN

## 2020-12-17 MED ORDER — LACTATED RINGERS IV SOLN: INTRAVENOUS | Status: DC

## 2020-12-17 MED ORDER — ACETAMINOPHEN 10 MG/ML IV SOLN: 1000.0000 mg | Freq: Once | INTRAVENOUS | Status: DC | PRN

## 2020-12-17 MED ORDER — MIDAZOLAM HCL 2 MG/2ML IJ SOLN
INTRAMUSCULAR | Status: DC | PRN
  Administered 2020-12-17: 1 mg via INTRAVENOUS

## 2020-12-17 MED ORDER — FENTANYL CITRATE (PF) 100 MCG/2ML IJ SOLN
INTRAMUSCULAR | Status: AC
  Filled 2020-12-17: qty 2

## 2020-12-17 MED ORDER — BUPIVACAINE-EPINEPHRINE (PF) 0.5% -1:200000 IJ SOLN
INTRAMUSCULAR | Status: DC | PRN
  Administered 2020-12-17: 30 mL via PERINEURAL

## 2020-12-17 MED ORDER — MIDAZOLAM HCL 2 MG/2ML IJ SOLN
2.0000 mg | Freq: Once | INTRAMUSCULAR | Status: AC
  Administered 2020-12-17: 14:00:00 2 mg via INTRAVENOUS

## 2020-12-17 MED ORDER — PROPOFOL 500 MG/50ML IV EMUL
INTRAVENOUS | Status: AC
  Filled 2020-12-17: qty 50

## 2020-12-17 SURGICAL SUPPLY — 32 items
BLADE SURG 15 STRL LF DISP TIS (BLADE) ×1 IMPLANT
BLADE SURG 15 STRL SS (BLADE) ×2
BNDG CMPR 9X4 STRL LF SNTH (GAUZE/BANDAGES/DRESSINGS) ×1
BNDG ELASTIC 4X5.8 VLCR STR LF (GAUZE/BANDAGES/DRESSINGS) ×4 IMPLANT
BNDG ESMARK 4X9 LF (GAUZE/BANDAGES/DRESSINGS) ×2 IMPLANT
COVER BACK TABLE 60X90IN (DRAPES) ×2 IMPLANT
CUFF TOURN SGL QUICK 24 (TOURNIQUET CUFF) ×2
CUFF TRNQT CYL 24X4X16.5-23 (TOURNIQUET CUFF) ×1 IMPLANT
DRAPE EXTREMITY T 121X128X90 (DISPOSABLE) ×2 IMPLANT
GAUZE 4X4 16PLY ~~LOC~~+RFID DBL (SPONGE) ×2 IMPLANT
GAUZE SPONGE 4X4 12PLY STRL (GAUZE/BANDAGES/DRESSINGS) ×2 IMPLANT
GAUZE SPONGE 4X4 12PLY STRL LF (GAUZE/BANDAGES/DRESSINGS) ×1 IMPLANT
GAUZE XEROFORM 1X8 LF (GAUZE/BANDAGES/DRESSINGS) ×2 IMPLANT
GLOVE SURG ENC MOIS LTX SZ7.5 (GLOVE) ×2 IMPLANT
GOWN STRL REUS W/ TWL LRG LVL3 (GOWN DISPOSABLE) ×1 IMPLANT
GOWN STRL REUS W/TWL LRG LVL3 (GOWN DISPOSABLE) ×2
HIBICLENS CHG 4% 4OZ (MISCELLANEOUS) ×2 IMPLANT
KIT TURNOVER CYSTO (KITS) ×2 IMPLANT
KNIFE CARPAL TUNNEL (BLADE) ×2 IMPLANT
NDL HYPO 25X1 1.5 SAFETY (NEEDLE) ×1 IMPLANT
NEEDLE HYPO 25X1 1.5 SAFETY (NEEDLE) ×2 IMPLANT
NS IRRIG 500ML POUR BTL (IV SOLUTION) ×2 IMPLANT
PACK BASIN DAY SURGERY FS (CUSTOM PROCEDURE TRAY) ×2 IMPLANT
PAD CAST 4YDX4 CTTN HI CHSV (CAST SUPPLIES) ×1 IMPLANT
PADDING CAST COTTON 4X4 STRL (CAST SUPPLIES) ×6
SLING ARM FOAM STRAP MED (SOFTGOODS) ×1 IMPLANT
SUT ETHILON 4 0 PS 2 18 (SUTURE) ×3 IMPLANT
SYR 10ML LL (SYRINGE) ×2 IMPLANT
SYR BULB EAR ULCER 3OZ GRN STR (SYRINGE) ×2 IMPLANT
TOWEL OR 17X26 10 PK STRL BLUE (TOWEL DISPOSABLE) ×2 IMPLANT
TRAY DSU PREP LF (CUSTOM PROCEDURE TRAY) ×2 IMPLANT
UNDERPAD 30X36 HEAVY ABSORB (UNDERPADS AND DIAPERS) ×2 IMPLANT

## 2020-12-17 NOTE — Transfer of Care (Signed)
Immediate Anesthesia Transfer of Care Note  Patient: Sheryl Landry  Procedure(s) Performed: Procedure(s) (LRB): Right carpal tunnel release and cubital tunnel release guyons canal release (Right)  Patient Location: PACU  Anesthesia Type: MAC  Level of Consciousness: awake, alert , oriented and patient cooperative  Airway & Oxygen Therapy: Patient Spontanous Breathing and Patient connected to face mask oxygen  Post-op Assessment: Report given to PACU RN and Post -op Vital signs reviewed and stable  Post vital signs: Reviewed and stable  Complications: No apparent anesthesia complications  Last Vitals:  Vitals Value Taken Time  BP 139/80 12/17/20 1600  Temp 36.4 C 12/17/20 1556  Pulse 72 12/17/20 1601  Resp 18 12/17/20 1601  SpO2 97 % 12/17/20 1601  Vitals shown include unvalidated device data.  Last Pain:  Vitals:   12/17/20 1313  TempSrc: Oral  PainSc: 8       Patients Stated Pain Goal: 8 (53/66/44 0347)  Complications: No notable events documented.

## 2020-12-17 NOTE — Anesthesia Postprocedure Evaluation (Signed)
Anesthesia Post Note  Patient: Sheryl Landry  Procedure(s) Performed: Right carpal tunnel release and cubital tunnel release guyons canal release (Right)     Patient location during evaluation: PACU Anesthesia Type: Regional Level of consciousness: awake and alert, awake and oriented Pain management: pain level controlled Vital Signs Assessment: post-procedure vital signs reviewed and stable Respiratory status: spontaneous breathing, nonlabored ventilation and respiratory function stable Cardiovascular status: stable and blood pressure returned to baseline Postop Assessment: no apparent nausea or vomiting Anesthetic complications: no   No notable events documented.  Last Vitals:  Vitals:   12/17/20 1615 12/17/20 1630  BP: (!) 165/91 (!) 175/96  Pulse: 74 74  Resp: 20 16  Temp:    SpO2: 96% 96%    Last Pain:  Vitals:   12/17/20 1630  TempSrc:   PainSc: 0-No pain                 Catalina Gravel

## 2020-12-17 NOTE — Anesthesia Procedure Notes (Addendum)
Anesthesia Regional Block: Axillary brachial plexus block   Pre-Anesthetic Checklist: , timeout performed,  Correct Patient, Correct Site, Correct Laterality,  Correct Procedure, Correct Position, site marked,  Risks and benefits discussed,  Surgical consent,  Pre-op evaluation,  At surgeon's request and post-op pain management  Laterality: Right  Prep: chloraprep       Needles:  Injection technique: Single-shot  Needle Type: Stimiplex     Needle Length: 9cm  Needle Gauge: 21     Additional Needles:   Procedures:,,,, ultrasound used (permanent image in chart),,    Narrative:  Start time: 12/17/2020 2:20 PM End time: 12/17/2020 2:30 PM  Performed by: Personally  Anesthesiologist: Murvin Natal, MD  Additional Notes: Functioning IV was confirmed and monitors were applied.  The arm was externally rotated to expose the axilla. A 65mm 21ga Arrow echogenic stimulator needle was used. Sterile prep,hand hygiene and sterile gloves were used.  Negative aspiration and negative test dose prior to incremental administration of local anesthetic. The patient tolerated the procedure well.

## 2020-12-17 NOTE — Discharge Instructions (Addendum)
Orthopaedic Hand Surgery Discharge Instructions  WEIGHT BEARING STATUS: Non weight bearing on operative extremity  DRESSINGS: Please keep your dressing/splint/cast clean and dry until your follow-up appointment. You may shower by placing a waterproof covering over your dressing/splint/cast. Contact your surgeon if your splint/cast gets wet. It will need to be changed to prevent skin breakdown.  PAIN CONTROL: First line medications for post operative pain control are Tylenol (acetaminophen) and Motrin (ibuprofen) if you are able to take these medications. If you have been prescribed a medication these can be taken as breakthrough pain medications. Please note that some narcotic pain medication have acetaminophen added and you should never consume more than 4,000mg  of acetaminophen in 24 hour period. Also please note that if you are given Toradol (ketoralac) you should not take similar medications simultaneously such as ibuprofen.   ICE/ELEVATION: Ice and elevate your injured extremity as needed. Avoid direct contact of ice with skin.  HOME MEDICATIONS: No changes have been made to your home medications.  FOLLOW UP: You will be called after surgery with an appointment date and time, however if you have not received a phone call within 3 days please call during regular office hours at [phone] to schedule a post operative appointment.  Please Seek Medical Attention if: Call MD for: pain or pressure in chest, jaw, arm, back, neck  Call MD for: temperature greater than 101 F for more than 24 hours  Call MD for: difficulty breathing Call MD for: Incision redness, bleeding, drainage  Call MD for: palpitations or feeling that the heart is racing  Call MD for: increased swelling in arm, leg, ankle, or abdomen  Call MD for: lightheadedness, dizziness, fainting Go to ED or call 911 if: chest pain does not go away after 3 nitroglycerin doses taken 5 min apart  Go to ED or call 911 for: any  uncontrolled bleeding  Go to ED or call 911 if: unable to reach physician  Discharge Medications: Allergies as of 12/17/2020       Reactions   Nsaids Other (See Comments)   because of gastric bypass surgery   Cortisone Rash, Other (See Comments)   rash after shoulder injection        Medication List     TAKE these medications    aspirin 81 MG EC tablet Take 81 mg by mouth daily.   buPROPion 300 MG 24 hr tablet Commonly known as: WELLBUTRIN XL Take 300 mg by mouth every morning.   HYDROcodone-acetaminophen 5-325 MG tablet Commonly known as: NORCO/VICODIN Take 1 tablet by mouth every 6 (six) hours as needed for up to 3 days for moderate pain.   metFORMIN 500 MG tablet Commonly known as: GLUCOPHAGE Take 1 tablet (500 mg total) by mouth daily with breakfast. For diabetes management What changed:  how much to take when to take this additional instructions   multivitamin with minerals Tabs tablet Take 1 tablet by mouth every evening.   QUEtiapine 400 MG tablet Commonly known as: SEROQUEL Take 400 mg by mouth at bedtime.   venlafaxine XR 150 MG 24 hr capsule Commonly known as: EFFEXOR-XR Take 300 mg by mouth at bedtime.   Vitamin B 12 500 MCG Tabs Take 500 mcg by mouth daily.          Sheryl [place], MD Orthopaedic Hand Surgeon [facility] Office number: [phone] [address] [place], [place] [address]   Post Anesthesia Home Care Instructions  Activity: Get plenty of rest for the remainder of the  day. A responsible individual must stay with you for 24 hours following the procedure.  For the next 24 hours, DO NOT: -Drive a car -Paediatric nurse -Drink alcoholic beverages -Take any medication unless instructed by your physician -Make any legal decisions or sign important papers.  Meals: Start with liquid foods such as gelatin or soup. Progress to regular foods as tolerated. Avoid greasy, spicy, heavy foods. If nausea and/or  vomiting occur, drink only clear liquids until the nausea and/or vomiting subsides. Call your physician if vomiting continues.  Special Instructions/Symptoms: Your throat may feel dry or sore from the anesthesia or the breathing tube placed in your throat during surgery. If this causes discomfort, gargle with warm salt water. The discomfort should disappear within 24 hours.  Regional Anesthesia Blocks  1. Numbness or the inability to move the "blocked" extremity may last from 3-48 hours after placement. The length of time depends on the medication injected and your individual response to the medication. If the numbness is not going away after 48 hours, call your surgeon.  2. The extremity that is blocked will need to be protected until the numbness is gone and the  Strength has returned. Because you cannot feel it, you will need to take extra care to avoid injury. Because it may be weak, you may have difficulty moving it or using it. You may not know what position it is in without looking at it while the block is in effect.  3. For blocks in the legs and feet, returning to weight bearing and walking needs to be done carefully. You will need to wait until the numbness is entirely gone and the strength has returned. You should be able to move your leg and foot normally before you try and bear weight or walk. You will need someone to be with you when you first try to ensure you do not fall and possibly risk injury.  4. Bruising and tenderness at the needle site are common side effects and will resolve in a few days.  5. Persistent numbness or new problems with movement should be communicated to the surgeon.  If you need to talk to someone confidentially call: [facility] [address] [place] [phone]

## 2020-12-17 NOTE — Anesthesia Preprocedure Evaluation (Addendum)
Anesthesia Evaluation  Patient identified by MRN, date of birth, ID band Patient awake    Reviewed: Allergy & Precautions, NPO status , Patient's Chart, lab work & pertinent test results  Airway Mallampati: II  TM Distance: >3 FB Neck ROM: Full    Dental  (+) Upper Dentures, Lower Dentures   Pulmonary former smoker,    Pulmonary exam normal breath sounds clear to auscultation       Cardiovascular negative cardio ROS Normal cardiovascular exam Rhythm:Regular Rate:Normal  ECG: SR   Neuro/Psych PSYCHIATRIC DISORDERS Anxiety Depression  Neuromuscular disease (Right arm neuropathy )    GI/Hepatic negative GI ROS, (+)     substance abuse  alcohol use,   Endo/Other  diabetes, Oral Hypoglycemic Agents  Renal/GU negative Renal ROS     Musculoskeletal Patient reports weakness and atrophy to the right arm.    Abdominal   Peds  Hematology negative hematology ROS (+)   Anesthesia Other Findings Right carpal, cubital, guyons canal compression neuropathy  Reproductive/Obstetrics                           Anesthesia Physical Anesthesia Plan  ASA: 3  Anesthesia Plan: Regional   Post-op Pain Management:    Induction: Intravenous  PONV Risk Score and Plan: 2 and Ondansetron, Dexamethasone, Propofol infusion, Treatment may vary due to age or medical condition and Midazolam  Airway Management Planned: Simple Face Mask  Additional Equipment:   Intra-op Plan:   Post-operative Plan:   Informed Consent: I have reviewed the patients History and Physical, chart, labs and discussed the procedure including the risks, benefits and alternatives for the proposed anesthesia with the patient or authorized representative who has indicated his/her understanding and acceptance.     Dental advisory given  Plan Discussed with: CRNA and Surgeon  Anesthesia Plan Comments:        Anesthesia Quick  Evaluation

## 2020-12-17 NOTE — Interval H&P Note (Signed)
History and Physical Interval Note:  12/17/2020 1:48 PM  Sheryl Landry  has presented today for surgery, with the diagnosis of Right carpal, cubital, guyons canal compression neuropathy.  The various methods of treatment have been discussed with the patient and family. After consideration of risks, benefits and other options for treatment, the patient has consented to  Procedure(s) with comments: Right carpal tunnel release and cubital tunnel release guyons canal release (Right) - with MAC as a surgical intervention.  The patient's history has been reviewed, patient examined, no change in status, stable for surgery.  I have reviewed the patient's chart and labs.  Questions were answered to the patient's satisfaction.     Orene Desanctis

## 2020-12-17 NOTE — Progress Notes (Signed)
Assisted Dr. Roanna Banning with right, ultrasound guided, axillary block. Side rails up, monitors on throughout procedure. See vital signs in flow sheet. Tolerated Procedure well.

## 2020-12-18 ENCOUNTER — Encounter (HOSPITAL_BASED_OUTPATIENT_CLINIC_OR_DEPARTMENT_OTHER): Payer: Self-pay | Admitting: Orthopedic Surgery

## 2020-12-18 NOTE — Op Note (Addendum)
OPERATIVE NOTE  DATE OF PROCEDURE: 12/17/2020  SURGEONS:  Primary: Orene Desanctis, MD  PREOPERATIVE DIAGNOSIS: Right carpal, cubital, guyons canal compression neuropathy  POSTOPERATIVE DIAGNOSIS: Same  NAME OF PROCEDURE:   Right carpal tunnel release Right cubital tunnel in situ release Right Guyons canal release of ulnar nerve at the wrist  ANESTHESIA: Regional  SKIN PREPARATION: Hibiclens  ESTIMATED BLOOD LOSS: Minimal  IMPLANTS: none  INDICATIONS:  Sheryl Landry is a 60 y.o. female who has the above preoperative diagnosis. The patient has decided to proceed with surgical intervention.  Risks, benefits and alternatives of operative management were discussed including, but not limited to, risks of anesthesia complications, infection, pain, persistent symptoms, stiffness, need for future surgery.  The patient understands, agrees and elects to proceed with surgery.    DESCRIPTION OF PROCEDURE: The patient was met in the pre-operative area and their identity was verified.  The operative location and laterality was also verified and marked.  The patient was brought to the OR and was placed supine on the table.  After repeat patient identification with the operative team anesthesia was provided and the patient was prepped and draped in the usual sterile fashion.  A final timeout was performed verifying the correction patient, procedure, location and laterality.  Preoperative antibiotics were provided and the right upper extremity was elevated and exsanguinated with an Esmarch and tourniquet inflated to 250 mmHg.  Surgery began by working on the medial elbow first.  The cubital tunnel release was performed in situ.  The skin and subcutaneous tissues were divided just posterior to the medial epi condyle over the location of the ulnar nerve.  Skin and subcutaneous tissues were divided   And the medial antebrachial cutaneous branches were protected.   The Osborne's fascia was then identified and released  both proximally and distally taking care to release distally both the superficial and deep bands over the flexor carpi ulnaris.  Hemostasis was obtained and there is excellent decompression of the right ulnar nerve.  With full range of motion of the elbow there was no dislocation of the ulnar nerve over the medial epicondyle.  The wound was thoroughly irrigated and closed with 4-0 nylon horizontal mattress sutures.  Attention was then turned to the right wrist.  The Guyon's canal with release of the ulnar nerve at the wrist was first performed.  A longitudinal curvilinear incision followed by Alyse Low across the wrist was designed.  Skin and subcutaneous tissues were divided and hemostasis was performed.  The ulnar neurovascular bundle was identified proximally first and the tight fascia overlying the nerve was released.  Then going across the wrist the ulnar neurovascular bundle was swept ulnarly and the hook of the hamate was identified.  The leading edge of the hypothenar muscles were identified in the deep motor branch of the ulnar nerve was decompressed thoroughly.  These decompressions were done under direct vision and the artery and the nerve were protected throughout this portion of the procedure.  At this time attention was turned to the right carpal tunnel release.  just radial to the hook of the hamate the transverse carpal ligament was completely divided under direct vision with a combination of a scalpel and blunt tenotomy scissors.  This was released both proximally and distally in the median nerve had excellent decompression.  There is no space-occupying lesions that were visualized in this area.  at this time the wound was thoroughly irrigated and closed with 4-0 nylon suture in horizontal mattress fashion. A sterile soft dressing  was applied.  The tourniquet was deflated and the fingers were pink and warm and well-perfused at the end of the case.  Patient was awoken from anesthesia and brought to  recovery in stable condition.  There were no complications during the case.  All counts were correct x2.   Matt Holmes, MD

## 2021-07-09 ENCOUNTER — Other Ambulatory Visit: Payer: Self-pay | Admitting: Nurse Practitioner

## 2021-07-09 DIAGNOSIS — Z780 Asymptomatic menopausal state: Secondary | ICD-10-CM

## 2021-07-09 DIAGNOSIS — Z1231 Encounter for screening mammogram for malignant neoplasm of breast: Secondary | ICD-10-CM

## 2021-07-29 ENCOUNTER — Emergency Department (HOSPITAL_BASED_OUTPATIENT_CLINIC_OR_DEPARTMENT_OTHER)
Admission: EM | Admit: 2021-07-29 | Discharge: 2021-07-29 | Disposition: A | Discharge status: Home / Self Care | Payer: 59 | Attending: Emergency Medicine | Admitting: Emergency Medicine

## 2021-07-29 ENCOUNTER — Other Ambulatory Visit: Payer: Self-pay

## 2021-07-29 DIAGNOSIS — E119 Type 2 diabetes mellitus without complications: Secondary | ICD-10-CM | POA: Insufficient documentation

## 2021-07-29 DIAGNOSIS — S0181XA Laceration without foreign body of other part of head, initial encounter: Secondary | ICD-10-CM | POA: Insufficient documentation

## 2021-07-29 DIAGNOSIS — W06XXXA Fall from bed, initial encounter: Secondary | ICD-10-CM | POA: Insufficient documentation

## 2021-07-29 DIAGNOSIS — Z853 Personal history of malignant neoplasm of breast: Secondary | ICD-10-CM | POA: Insufficient documentation

## 2021-07-29 DIAGNOSIS — Z87891 Personal history of nicotine dependence: Secondary | ICD-10-CM | POA: Diagnosis not present

## 2021-07-29 DIAGNOSIS — S0990XA Unspecified injury of head, initial encounter: Secondary | ICD-10-CM | POA: Diagnosis present

## 2021-07-29 MED ORDER — LIDOCAINE HCL (PF) 1 % IJ SOLN
5.0000 mL | Freq: Once | INTRAMUSCULAR | Status: AC
  Administered 2021-07-29: 5 mL
  Filled 2021-07-29: qty 5

## 2021-07-29 MED ORDER — ACETAMINOPHEN 500 MG PO TABS
1000.0000 mg | ORAL_TABLET | Freq: Once | ORAL | Status: AC
  Administered 2021-07-29: 1000 mg via ORAL
  Filled 2021-07-29: qty 2

## 2021-07-29 NOTE — Discharge Instructions (Signed)
You were evaluated in the Emergency Department and after careful evaluation, we did not find any emergent condition requiring admission or further testing in the hospital.  Your exam/testing today was overall reassuring.  We repaired your laceration here in the emergency department.  Your stitches will need to be removed in 3 to 5 days by a healthcare professional.  Please return to the Emergency Department if you experience any worsening of your condition.  Thank you for allowing [place] to be a part of your care.

## 2021-07-29 NOTE — ED Triage Notes (Signed)
Pt via POV with s/o at bedside d/t laceration to right eyebrow approx 1.5 hr PTA. States she was falling asleep, dozed off, falling off of bed hitting the nightstand and a hard plastic stool. Bleeding controlled. Pt denies LOC. C/o ongoing dizziness. No blood thinners. Pt was ambulatory to room.

## 2021-07-29 NOTE — ED Provider Notes (Signed)
[facility] Emergency Department Provider Note MRN:  [id number]  Arrival date & time: 07/29/21     Chief Complaint   Facial Laceration   History of Present Illness   Sheryl Landry is a 61 y.o. year-old female with no pertinent past medical history presenting to the ED with chief complaint of facial laceration.  Patient was falling asleep while sitting up in bed and she lost balance and fell to the side, striking her right brow against the nightstand.  No loss of consciousness, no nausea or vomiting since the fall, no neck or back pain, no other complaints other than laceration.  Review of Systems  A thorough review of systems was obtained and all systems are negative except as noted in the HPI and PMH.   Patient's Health History    Past Medical History:  Diagnosis Date   Carpal tunnel syndrome, right    History of alcohol use    12-15-2020  per pt quick alcohol 03/ 2022, however documented in epic 06-29-2020 pt positive elevated  alcohol level   History of eye disorder 05/2019   per pt had episode double vision, evaluation by neurologist , Dr Krista Blue note in epic strabismus vs 6th nerve palsy;  per pt symptoms resolved   History of left breast cancer 1999   s/p left mastectomy and completed chemo   MDD (major depressive disorder)    Type 2 diabetes mellitus ([place])    followed by pcp  (12-15-2020 pt stated checks blood sugar every other day fasting, average sugar 77-100)   Wears glasses     Past Surgical History:  Procedure Laterality Date   BIOPSY  03/12/2018   Procedure: BIOPSY;  Surgeon: Alphonsa Overall, MD;  Location: [facility];  Service: General;;   CARPAL TUNNEL RELEASE Right 12/17/2020   Procedure: Right carpal tunnel release and cubital tunnel release guyons canal release;  Surgeon: Orene Desanctis, MD;  Location: [facility];  Service: Orthopedics;  Laterality: Right;  with MAC   COLONOSCOPY WITH ESOPHAGOGASTRODUODENOSCOPY (EGD)   08/2020   per pt @ [facility]   ESOPHAGOGASTRODUODENOSCOPY (EGD) WITH PROPOFOL N/A 03/12/2018   Procedure: ESOPHAGOGASTRODUODENOSCOPY (EGD) WITH PROPOFOL;  Surgeon: Alphonsa Overall, MD;  Location: [facility];  Service: General;  Laterality: N/A;   LAPAROSCOPIC INTERNAL HERNIA REPAIR  08/22/2005   _0 ;  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   MASTECTOMY Left 1999   ROUX-EN-Y GASTRIC BYPASS  10/07/2003   _1 ;   laparoscopic approach w/ cholecystectomy   TOTAL ABDOMINAL HYSTERECTOMY W/ BILATERAL SALPINGOOPHORECTOMY  07/26/1999   _2     Family History  Problem Relation Age of Onset   Diabetes Father    Heart disease Father    Dementia Father    Diabetes type II Mother    Hypertension Mother    Stroke Mother     Social History   Socioeconomic History   Marital status: Married    Spouse name: Not on file   Number of children: 3   Years of education: Some coll   Highest education level: Not on file  Occupational History   Occupation: Unemployed  Tobacco Use   Smoking status: Former    Years: 0.50    Types: Cigarettes    Quit date: 12/03/2018    Years since quitting: 2.6   Smokeless tobacco: Never   Tobacco comments:    2-3 cigarettes per day.  Vaping Use   Vaping Use: Never used  Substance and Sexual Activity   Alcohol  use: Not Currently    Comment: hx alcohol use  (12-15-2020  per pt stopped alcohol 03/ 2022)   Drug use: No   Sexual activity: Not on file  Other Topics Concern   Not on file  Social History Narrative   Lives at home with her husband.   Right-handed.   4 cups caffeine per day.   Social Determinants of Health   Financial Resource Strain: Not on file  Food Insecurity: Not on file  Transportation Needs: Not on file  Physical Activity: Not on file  Stress: Not on file  Social Connections: Not on file  Intimate Partner Violence: Not on file     Physical Exam   Vitals:   07/29/21 0424  BP: (!) 157/94  Pulse: 71  Resp: 17  Temp: 98 F  (36.7 C)  SpO2: 99%    CONSTITUTIONAL: Well-appearing, NAD NEURO/PSYCH:  Alert and oriented x 3, no focal deficits EYES:  eyes equal and reactive ENT/NECK:  no LAD, no JVD CARDIO: Regular rate, well-perfused, normal S1 and S2 PULM:  CTAB no wheezing or rhonchi GI/GU:  non-distended, non-tender MSK/SPINE:  No gross deformities, no edema SKIN:  no rash, laceration of the right brow extending medially to the forehead   *Additional and/or pertinent findings included in MDM below  Diagnostic and Interventional Summary    EKG Interpretation  Date/Time:    Ventricular Rate:    PR Interval:    QRS Duration:   QT Interval:    QTC Calculation:   R Axis:     Text Interpretation:         Labs Reviewed - No data to display  No orders to display    Medications  lidocaine (PF) (XYLOCAINE) 1 % injection 5 mL (5 mLs Other Given by Other 07/29/21 0446)  acetaminophen (TYLENOL) tablet 1,000 mg (1,000 mg Oral Given 07/29/21 0437)     Procedures  /  Critical Care .Marland KitchenLaceration Repair  Date/Time: 07/29/2021 5:04 AM  Performed by: Maudie Flakes, MD Authorized by: Maudie Flakes, MD   Consent:    Consent obtained:  Verbal   Consent given by:  Patient   Risks, benefits, and alternatives were discussed: yes     Risks discussed:  Infection, need for additional repair, nerve damage, pain, poor cosmetic result, poor wound healing, retained foreign body, tendon damage and vascular damage   Alternatives discussed: dermabond. Universal protocol:    Procedure explained and questions answered to patient or proxy's satisfaction: yes     Immediately prior to procedure, a time out was called: yes     Patient identity confirmed:  Verbally with patient Anesthesia:    Anesthesia method:  Local infiltration   Local anesthetic:  Lidocaine 1% w/o epi Laceration details:    Location:  Face   Face location:  R eyebrow   Length (cm):  2.5   Depth (mm):  3 Pre-procedure details:    Preparation:   Patient was prepped and draped in usual sterile fashion Exploration:    Limited defect created (wound extended): no     Hemostasis achieved with:  Direct pressure   Wound exploration: wound explored through full range of motion and entire depth of wound visualized     Contaminated: no   Treatment:    Area cleansed with:  Saline   Amount of cleaning:  Standard   Debridement:  None   Undermining:  None Skin repair:    Repair method:  Sutures   Suture size:  5-0   Suture material:  Prolene   Suture technique:  Simple interrupted   Number of sutures:  5 Approximation:    Approximation:  Close Repair type:    Repair type:  Simple Post-procedure details:    Dressing:  Antibiotic ointment and sterile dressing   Procedure completion:  Tolerated well, no immediate complications   ED Course and Medical Decision Making  Initial Impression and Ddx Facial laceration, minimal head trauma.  No other complaints.  Per [place] head CT rules, no indication for imaging at this time.  Repaired as described above, appropriate for discharge.  Past medical/surgical history that increases complexity of ED encounter: None  Interpretation of Diagnostics Laboratory and/or imaging options to aid in the diagnosis/care of the patient were considered.  After careful history and physical examination, it was determined that there was no indication for diagnostics at this time.  Patient Reassessment and Ultimate Disposition/Management     Discharge  Patient management required discussion with the following services or consulting groups:  None  Complexity of Problems Addressed Acute illness or injury that poses threat of life of bodily function  Additional Data Reviewed and Analyzed Further history obtained from: Further history from spouse/family member  Additional Factors Impacting ED Encounter Risk Minor Procedures  Barth Kirks. [place] Small, [place] [email or web address]  Final Clinical Impressions(s) / ED Diagnoses     ICD-10-CM   1. Laceration of brow without complication, initial encounter  S01.81XA       ED Discharge Orders     None        Discharge Instructions Discussed with and Provided to Patient:    Discharge Instructions      You were evaluated in the Emergency Department and after careful evaluation, we did not find any emergent condition requiring admission or further testing in the hospital.  Your exam/testing today was overall reassuring.  We repaired your laceration here in the emergency department.  Your stitches will need to be removed in 3 to 5 days by a healthcare professional.  Please return to the Emergency Department if you experience any worsening of your condition.  Thank you for allowing [place] to be a part of your care.       Maudie Flakes, MD 07/29/21 [phone]

## 2021-12-16 ENCOUNTER — Ambulatory Visit: Payer: 59

## 2021-12-16 ENCOUNTER — Inpatient Hospital Stay: Admission: RE | Admit: 2021-12-16 | Payer: 59 | Source: Ambulatory Visit

## 2022-02-01 ENCOUNTER — Ambulatory Visit
Admission: RE | Admit: 2022-02-01 | Discharge: 2022-02-01 | Disposition: A | Discharge status: Home / Self Care | Payer: 59 | Source: Ambulatory Visit | Attending: Nurse Practitioner | Admitting: Nurse Practitioner

## 2022-02-01 DIAGNOSIS — Z780 Asymptomatic menopausal state: Secondary | ICD-10-CM

## 2022-02-18 ENCOUNTER — Ambulatory Visit: Payer: 59

## 2022-04-08 ENCOUNTER — Ambulatory Visit: Payer: 59

## 2022-04-08 ENCOUNTER — Ambulatory Visit
Admission: RE | Admit: 2022-04-08 | Discharge: 2022-04-08 | Disposition: A | Discharge status: Home / Self Care | Payer: 59 | Source: Ambulatory Visit | Attending: Nurse Practitioner | Admitting: Nurse Practitioner

## 2022-04-08 DIAGNOSIS — Z1231 Encounter for screening mammogram for malignant neoplasm of breast: Secondary | ICD-10-CM

## 2022-04-13 ENCOUNTER — Other Ambulatory Visit: Payer: Self-pay | Admitting: Family Medicine

## 2022-04-13 ENCOUNTER — Other Ambulatory Visit: Payer: Self-pay | Admitting: Nurse Practitioner

## 2022-04-13 DIAGNOSIS — R928 Other abnormal and inconclusive findings on diagnostic imaging of breast: Secondary | ICD-10-CM

## 2022-04-17 ENCOUNTER — Emergency Department (HOSPITAL_BASED_OUTPATIENT_CLINIC_OR_DEPARTMENT_OTHER): Payer: 59

## 2022-04-17 ENCOUNTER — Other Ambulatory Visit: Payer: Self-pay

## 2022-04-17 ENCOUNTER — Emergency Department (HOSPITAL_BASED_OUTPATIENT_CLINIC_OR_DEPARTMENT_OTHER)
Admission: EM | Admit: 2022-04-17 | Discharge: 2022-04-17 | Disposition: A | Discharge status: Home / Self Care | Payer: 59 | Attending: Emergency Medicine | Admitting: Emergency Medicine

## 2022-04-17 ENCOUNTER — Encounter (HOSPITAL_BASED_OUTPATIENT_CLINIC_OR_DEPARTMENT_OTHER): Payer: Self-pay

## 2022-04-17 DIAGNOSIS — N3000 Acute cystitis without hematuria: Secondary | ICD-10-CM | POA: Insufficient documentation

## 2022-04-17 DIAGNOSIS — R300 Dysuria: Secondary | ICD-10-CM | POA: Diagnosis present

## 2022-04-17 DIAGNOSIS — L905 Scar conditions and fibrosis of skin: Secondary | ICD-10-CM | POA: Diagnosis not present

## 2022-04-17 DIAGNOSIS — D72829 Elevated white blood cell count, unspecified: Secondary | ICD-10-CM | POA: Insufficient documentation

## 2022-04-17 DIAGNOSIS — E119 Type 2 diabetes mellitus without complications: Secondary | ICD-10-CM | POA: Diagnosis not present

## 2022-04-17 DIAGNOSIS — Z7984 Long term (current) use of oral hypoglycemic drugs: Secondary | ICD-10-CM | POA: Diagnosis not present

## 2022-04-17 DIAGNOSIS — Z7982 Long term (current) use of aspirin: Secondary | ICD-10-CM | POA: Insufficient documentation

## 2022-04-17 LAB — CBC WITH DIFFERENTIAL/PLATELET
Abs Immature Granulocytes: 0.06 10*3/uL (ref 0.00–0.07)
Basophils Absolute: 0 10*3/uL (ref 0.0–0.1)
Basophils Relative: 0 %
Eosinophils Absolute: 0.1 10*3/uL (ref 0.0–0.5)
Eosinophils Relative: 1 %
HCT: 36.2 % (ref 36.0–46.0)
Hemoglobin: 11.4 g/dL — ABNORMAL LOW (ref 12.0–15.0)
Immature Granulocytes: 1 %
Lymphocytes Relative: 19 %
Lymphs Abs: 2 10*3/uL (ref 0.7–4.0)
MCH: 26.8 pg (ref 26.0–34.0)
MCHC: 31.5 g/dL (ref 30.0–36.0)
MCV: 85.2 fL (ref 80.0–100.0)
Monocytes Absolute: 1.7 10*3/uL — ABNORMAL HIGH (ref 0.1–1.0)
Monocytes Relative: 16 %
Neutro Abs: 6.8 10*3/uL (ref 1.7–7.7)
Neutrophils Relative %: 63 %
Platelets: 279 10*3/uL (ref 150–400)
RBC: 4.25 MIL/uL (ref 3.87–5.11)
RDW: 14 % (ref 11.5–15.5)
WBC: 10.7 10*3/uL — ABNORMAL HIGH (ref 4.0–10.5)
nRBC: 0 % (ref 0.0–0.2)

## 2022-04-17 LAB — URINALYSIS, ROUTINE W REFLEX MICROSCOPIC
Bilirubin Urine: NEGATIVE
Glucose, UA: NEGATIVE mg/dL
Hgb urine dipstick: NEGATIVE
Ketones, ur: NEGATIVE mg/dL
Nitrite: NEGATIVE
Specific Gravity, Urine: 1.012 (ref 1.005–1.030)
pH: 6 (ref 5.0–8.0)

## 2022-04-17 LAB — COMPREHENSIVE METABOLIC PANEL
ALT: 22 U/L (ref 0–44)
AST: 20 U/L (ref 15–41)
Albumin: 4.1 g/dL (ref 3.5–5.0)
Alkaline Phosphatase: 88 U/L (ref 38–126)
Anion gap: 10 (ref 5–15)
BUN: 10 mg/dL (ref 6–20)
CO2: 24 mmol/L (ref 22–32)
Calcium: 9.4 mg/dL (ref 8.9–10.3)
Chloride: 104 mmol/L (ref 98–111)
Creatinine, Ser: 0.95 mg/dL (ref 0.44–1.00)
GFR, Estimated: >60 mL/min (ref >60)
Glucose, Bld: 91 mg/dL (ref 70–99)
Potassium: 3.2 mmol/L — ABNORMAL LOW (ref 3.5–5.1)
Sodium: 138 mmol/L (ref 135–145)
Total Bilirubin: 0.3 mg/dL (ref 0.3–1.2)
Total Protein: 8 g/dL (ref 6.5–8.1)

## 2022-04-17 LAB — LIPASE, BLOOD: Lipase: 11 U/L (ref 11–51)

## 2022-04-17 MED ORDER — HYDROMORPHONE HCL 1 MG/ML IJ SOLN
1.0000 mg | Freq: Once | INTRAMUSCULAR | Status: AC
  Administered 2022-04-17: 1 mg via INTRAVENOUS
  Filled 2022-04-17: qty 1

## 2022-04-17 MED ORDER — FENTANYL CITRATE PF 50 MCG/ML IJ SOSY
50.0000 ug | PREFILLED_SYRINGE | Freq: Once | INTRAMUSCULAR | Status: AC
  Administered 2022-04-17: 50 ug via INTRAVENOUS
  Filled 2022-04-17: qty 1

## 2022-04-17 MED ORDER — OXYCODONE HCL 15 MG PO TABS: 15.0000 mg | ORAL_TABLET | Freq: Four times a day (QID) | ORAL | 0 refills | Status: AC | PRN

## 2022-04-17 MED ORDER — CEFPODOXIME PROXETIL 200 MG PO TABS: 200.0000 mg | ORAL_TABLET | Freq: Two times a day (BID) | ORAL | 0 refills | Status: DC

## 2022-04-17 MED ORDER — IOHEXOL 300 MG/ML  SOLN
100.0000 mL | Freq: Once | INTRAMUSCULAR | Status: AC | PRN
  Administered 2022-04-17: 80 mL via INTRAVENOUS

## 2022-04-17 MED ORDER — ONDANSETRON HCL 4 MG/2ML IJ SOLN
4.0000 mg | Freq: Once | INTRAMUSCULAR | Status: AC
  Administered 2022-04-17: 4 mg via INTRAVENOUS
  Filled 2022-04-17: qty 2

## 2022-04-17 MED ORDER — SODIUM CHLORIDE 0.9 % IV BOLUS
1000.0000 mL | Freq: Once | INTRAVENOUS | Status: AC
  Administered 2022-04-17: 1000 mL via INTRAVENOUS

## 2022-04-17 MED ORDER — SODIUM CHLORIDE 0.9 % IV SOLN
2.0000 g | Freq: Once | INTRAVENOUS | Status: AC
  Administered 2022-04-17: 2 g via INTRAVENOUS
  Filled 2022-04-17: qty 20

## 2022-04-17 MED ORDER — ONDANSETRON HCL 4 MG PO TABS: 4.0000 mg | ORAL_TABLET | Freq: Four times a day (QID) | ORAL | 0 refills | Status: DC

## 2022-04-17 NOTE — ED Notes (Signed)
Patient requested drink, given drink option of choice

## 2022-04-17 NOTE — ED Triage Notes (Signed)
Patient here POV from Home.  Endorses Right Sided ABD Pain for approximately 2 Weeks. Worsened since. Some nausea. No emesis. Some Diarrhea. Associated with Dysuria. No Known Fever.   Seen at [facility] and sent for Assessment. Performed an ABD XR which she has a CD for.   NAD Noted during Triage. A&Ox4. GCS 15. Ambulatory.

## 2022-04-17 NOTE — ED Notes (Signed)
Reviewed AVS/discharge instruction with patient. Time allotted for and all questions answered. Patient is agreeable for d/c and escorted to ed exit by staff.  

## 2022-04-17 NOTE — ED Notes (Signed)
Patient transported to CT 

## 2022-04-17 NOTE — ED Provider Notes (Signed)
[facility] Provider Note   CSN: [id number] Arrival date & time: 04/17/22  1420     History  Chief Complaint  Patient presents with   Abdominal Pain    Sheryl Landry is a 62 y.o. female.   Abdominal Pain    Patient with medical history of type 2 diabetes, history of alcohol use disorder, MDD presents to the emergency department due to abdominal pain.  Started 8 days ago initially with dysuria and increased urinary frequency, started having right upper quadrant pain which was similar to the flank around the same time.  Associate with nausea but no vomiting, no hematuria.  She states she has been having some diarrhea no constipation or blood in the stool.  No recent changes in medication.  Denies any chest pain, shortness of breath, fevers although she has had chills at home.  No recent antibiotics, travel.    Patient seen in urgent care, diagnosed with UTI sent to ED for further evaluation due to the abdominal pain.  Surgical history notable for total hysterectomy with bilateral salpingo-oophorectomy, Roux-en-Y bypass, laparoscopic internal hernia repair, cholecystectomy, mastectomy status post breast cancer  Home Medications Prior to Admission medications   Medication Sig Start Date End Date Taking? Authorizing Provider  cefpodoxime (VANTIN) 200 MG tablet Take 1 tablet (200 mg total) by mouth 2 (two) times daily. 04/17/22  Yes Sherrill Raring, PA-C  ondansetron (ZOFRAN) 4 MG tablet Take 1 tablet (4 mg total) by mouth every 6 (six) hours. 04/17/22  Yes Sherrill Raring, PA-C  oxyCODONE (ROXICODONE) 15 MG immediate release tablet Take 1 tablet (15 mg total) by mouth every 6 (six) hours as needed for up to 5 days for pain. 04/17/22 04/22/22 Yes Sherrill Raring, PA-C  aspirin 81 MG EC tablet Take 81 mg by mouth daily.    [provider]  buPROPion (WELLBUTRIN XL) 300 MG 24 hr tablet Take 300 mg by mouth every morning. 04/11/19   [provider]  Cyanocobalamin (VITAMIN B 12) 500 MCG TABS Take 500 mcg by mouth daily.    [provider]  metFORMIN (GLUCOPHAGE) 500 MG tablet Take 1 tablet (500 mg total) by mouth daily with breakfast. For diabetes management Patient taking differently: Take 1,000 mg by mouth every evening. 09/23/13   Encarnacion Slates, NP  Multiple Vitamin (MULTIVITAMIN WITH MINERALS) TABS tablet Take 1 tablet by mouth every evening.     [provider]  QUEtiapine (SEROQUEL) 400 MG tablet Take 400 mg by mouth at bedtime.    [provider]  venlafaxine XR (EFFEXOR-XR) 150 MG 24 hr capsule Take 300 mg by mouth at bedtime. 01/04/20   [provider]      Allergies    Nsaids and Cortisone    Review of Systems   Review of Systems  Gastrointestinal:  Positive for abdominal pain.    Physical Exam Updated Vital Signs BP (!) 143/81   Pulse 76   Temp (!) 97.5 F (36.4 C)   Resp 20   Ht 5\' 2"  (1.575 m)   Wt 73 kg   SpO2 96%   BMI 29.44 kg/m  Physical Exam Vitals and nursing note reviewed. Exam conducted with a chaperone present.  Constitutional:      Appearance: Normal appearance.  HENT:     Head: Normocephalic and atraumatic.  Eyes:     General: No scleral icterus.       Right eye: No discharge.  Left eye: No discharge.     Extraocular Movements: Extraocular movements intact.     Pupils: Pupils are equal, round, and reactive to light.  Cardiovascular:     Rate and Rhythm: Normal rate and regular rhythm.     Pulses: Normal pulses.     Heart sounds: Normal heart sounds. No murmur heard.    No friction rub. No gallop.     Comments: Upper and lower extremity pulses are symmetric bilaterally Pulmonary:     Effort: Pulmonary effort is normal. No respiratory distress.     Breath sounds: Normal breath sounds.  Abdominal:     General: Abdomen is flat. A surgical scar is present. Bowel sounds are normal. There is no distension.     Palpations: Abdomen is  soft.     Tenderness: There is abdominal tenderness in the right upper quadrant and epigastric area. There is right CVA tenderness.  Skin:    General: Skin is warm and dry.     Coloration: Skin is not jaundiced.  Neurological:     Mental Status: She is alert. Mental status is at baseline.     Coordination: Coordination normal.     ED Results / Procedures / Treatments   Labs (all labs ordered are listed, but only abnormal results are displayed) Labs Reviewed  CBC WITH DIFFERENTIAL/PLATELET - Abnormal; Notable for the following components:      Result Value   WBC 10.7 (*)    Hemoglobin 11.4 (*)    Monocytes Absolute 1.7 (*)    All other components within normal limits  COMPREHENSIVE METABOLIC PANEL - Abnormal; Notable for the following components:   Potassium 3.2 (*)    All other components within normal limits  URINALYSIS, ROUTINE W REFLEX MICROSCOPIC - Abnormal; Notable for the following components:   Protein, ur TRACE (*)    Leukocytes,Ua MODERATE (*)    Bacteria, UA RARE (*)    All other components within normal limits  LIPASE, BLOOD    EKG None  Radiology CT ABDOMEN PELVIS W CONTRAST  Result Date: 04/17/2022 CLINICAL DATA:  Acute abdominal pain. EXAM: CT ABDOMEN AND PELVIS WITH CONTRAST TECHNIQUE: Multidetector CT imaging of the abdomen and pelvis was performed using the standard protocol following bolus administration of intravenous contrast. RADIATION DOSE REDUCTION: This exam was performed according to the departmental dose-optimization program which includes automated exposure control, adjustment of the mA and/or kV according to patient size and/or use of iterative reconstruction technique. CONTRAST:  50mL OMNIPAQUE IOHEXOL 300 MG/ML  SOLN COMPARISON:  CT abdomen pelvis dated 05/20/2020. FINDINGS: Lower chest: Minimal bibasilar atelectasis. A left breast implant is noted. Hepatobiliary: No focal liver abnormality is seen. Status post cholecystectomy. No biliary  dilatation. Pancreas: Unremarkable. No pancreatic ductal dilatation or surrounding inflammatory changes. Spleen: Normal in size without focal abnormality. Adrenals/Urinary Tract: Adrenal glands are unremarkable. Multiple hypoattenuating lesions in the left kidney are not significantly changed since prior exams and most consistent with benign cysts. A calcified lesion in the upper pole of the left kidney is redemonstrated and may represent prior injury. Right kidney appears normal. No renal calculi on either side. No hydronephrosis. There is ureteral hyperenhancement on both sides with mild surrounding fat stranding. Bladder is unremarkable. Stomach/Bowel: The patient is status gastric bypass. Appendix appears normal. No evidence of bowel wall thickening, distention, or inflammatory changes. Vascular/Lymphatic: Aortic atherosclerosis. No enlarged abdominal or pelvic lymph nodes. Reproductive: Status post hysterectomy. No adnexal masses. Other: No abdominal wall hernia or abnormality. No abdominopelvic ascites.  Musculoskeletal: Degenerative changes are seen in the spine. IMPRESSION: 1. Bilateral ureteral hyperenhancement with mild surrounding fat stranding is suspicious for ascending urinary tract infection. Aortic Atherosclerosis (ICD10-I70.0). Electronically Signed   By: Zerita Boers M.D.   On: 04/17/2022 17:30    Procedures Procedures    Medications Ordered in ED Medications  sodium chloride 0.9 % bolus 1,000 mL (0 mLs Intravenous Stopped 04/17/22 1715)  fentaNYL (SUBLIMAZE) injection 50 mcg (50 mcg Intravenous Given 04/17/22 1550)  ondansetron (ZOFRAN) injection 4 mg (4 mg Intravenous Given 04/17/22 1550)  cefTRIAXone (ROCEPHIN) 2 g in sodium chloride 0.9 % 100 mL IVPB (0 g Intravenous Stopped 04/17/22 1722)  iohexol (OMNIPAQUE) 300 MG/ML solution 100 mL (80 mLs Intravenous Contrast Given 04/17/22 1631)  HYDROmorphone (DILAUDID) injection 1 mg (1 mg Intravenous Given 04/17/22 1704)    ED Course/ Medical  Decision Making/ A&P                             Medical Decision Making Amount and/or Complexity of Data Reviewed Labs: ordered. Radiology: ordered.  Risk Prescription drug management.   Is a 62 year old female presenting to the emergency department due to dysuria and abdominal pain.  Differential includes but not limited to pyelonephritis, UTI, nephrolithiasis, septic stone, sepsis, dehydration, AKI, bowel obstruction, atypical ACS, vaginitis.  Patient does not meet any SIRS criteria, clinically I do not think this is sepsis.  She has some slight right upper quadrant tenderness and mild right CVA tenderness, no rash or's evidence of shingles.    I reviewed external medical records including previous surgeries and urgent care note.   Will start with labs, UA and CT abdomen to evaluate for indications of Pilo vs SBO vs other.  Ordered fluid bolus, Zofran, fentanyl.  Laboratory workup notable for signs of UTI with moderate leukocytes, no hematuria.  CMP without gross lecture derangement, AKI or transaminitis, potassium slightly decreased at 3.2.  Lipase within normal limits, not pancreatitis.  CBC with very mild leukocytosis 10.7.  CT ab pelvis is notable for ascending UTI.  I am concerned about developing pyelonephritis.  Considered admission but she does not meet SIRS criteria and is not septic.  She is tolerating p.o., engaged in shared decision-making with patient she would prefer outpatient trial of antibiotics and close follow-up rather than being admitted at this time I discussed very strict return precautions with the patient and will send home with antibiotics, narcotics and antiemetics.   Discharge vitals - BP (!) 143/81   Pulse 76   Temp (!) 97.5 F (36.4 C)   Resp 20   Ht 5\' 2"  (1.575 m)   Wt 73 kg   SpO2 96%   BMI 29.44 kg/m          Final Clinical Impression(s) / ED Diagnoses Final diagnoses:  Acute cystitis without hematuria    Rx / DC Orders ED  Discharge Orders          Ordered    cefpodoxime (VANTIN) 200 MG tablet  2 times daily        04/17/22 1754    oxyCODONE (ROXICODONE) 15 MG immediate release tablet  Every 6 hours PRN        04/17/22 1754    ondansetron (ZOFRAN) 4 MG tablet  Every 6 hours        04/17/22 1754              Sherrill Raring, PA-C 04/17/22 1804  Lennice Sites, DO 04/17/22 1811

## 2022-04-17 NOTE — Discharge Instructions (Signed)
You are seen today in the emergency department due to abdominal pain, you have a urinary tract infection which is moving up towards your kidney.  This should improve with the antibiotics, start taking Vantin once in the morning and once in the evening for the next 10 days.  Take Zofran every 6 hours as needed for nausea, take the oxycodone every 6 hours as needed for pain, take this with the Zofran to help prevent any nausea.  Return to the ED if you are unable to eat or drink without vomiting, severe pain, fevers, worsening symptoms.  Otherwise help with your primary for reevaluation next week to make sure you are improving.

## 2022-04-26 ENCOUNTER — Ambulatory Visit
Admission: RE | Admit: 2022-04-26 | Discharge: 2022-04-26 | Disposition: A | Discharge status: Home / Self Care | Payer: 59 | Source: Ambulatory Visit | Attending: Nurse Practitioner | Admitting: Nurse Practitioner

## 2022-04-26 DIAGNOSIS — R928 Other abnormal and inconclusive findings on diagnostic imaging of breast: Secondary | ICD-10-CM

## 2022-08-26 ENCOUNTER — Emergency Department (HOSPITAL_BASED_OUTPATIENT_CLINIC_OR_DEPARTMENT_OTHER)
Admission: EM | Admit: 2022-08-26 | Discharge: 2022-08-27 | Disposition: A | Discharge status: Home / Self Care | Payer: 59 | Attending: Emergency Medicine | Admitting: Emergency Medicine

## 2022-08-26 ENCOUNTER — Emergency Department (HOSPITAL_COMMUNITY)
Admission: EM | Admit: 2022-08-26 | Discharge: 2022-08-26 | Disposition: A | Discharge status: Home / Self Care | Payer: 59 | Attending: Emergency Medicine | Admitting: Emergency Medicine

## 2022-08-26 ENCOUNTER — Encounter (HOSPITAL_BASED_OUTPATIENT_CLINIC_OR_DEPARTMENT_OTHER): Payer: Self-pay

## 2022-08-26 ENCOUNTER — Other Ambulatory Visit: Payer: Self-pay

## 2022-08-26 ENCOUNTER — Encounter (HOSPITAL_COMMUNITY): Payer: Self-pay

## 2022-08-26 ENCOUNTER — Emergency Department (HOSPITAL_COMMUNITY): Payer: 59

## 2022-08-26 DIAGNOSIS — Z7982 Long term (current) use of aspirin: Secondary | ICD-10-CM | POA: Insufficient documentation

## 2022-08-26 DIAGNOSIS — R1084 Generalized abdominal pain: Secondary | ICD-10-CM | POA: Insufficient documentation

## 2022-08-26 DIAGNOSIS — E119 Type 2 diabetes mellitus without complications: Secondary | ICD-10-CM | POA: Diagnosis not present

## 2022-08-26 DIAGNOSIS — E871 Hypo-osmolality and hyponatremia: Secondary | ICD-10-CM | POA: Diagnosis not present

## 2022-08-26 DIAGNOSIS — R109 Unspecified abdominal pain: Secondary | ICD-10-CM | POA: Insufficient documentation

## 2022-08-26 DIAGNOSIS — Z7984 Long term (current) use of oral hypoglycemic drugs: Secondary | ICD-10-CM | POA: Insufficient documentation

## 2022-08-26 LAB — CBC WITH DIFFERENTIAL/PLATELET
Abs Immature Granulocytes: 0.02 10*3/uL (ref 0.00–0.07)
Basophils Absolute: 0 10*3/uL (ref 0.0–0.1)
Basophils Relative: 0 %
Eosinophils Absolute: 0 10*3/uL (ref 0.0–0.5)
Eosinophils Relative: 0 %
HCT: 32.4 % — ABNORMAL LOW (ref 36.0–46.0)
Hemoglobin: 10.3 g/dL — ABNORMAL LOW (ref 12.0–15.0)
Immature Granulocytes: 0 %
Lymphocytes Relative: 17 %
Lymphs Abs: 1 10*3/uL (ref 0.7–4.0)
MCH: 27 pg (ref 26.0–34.0)
MCHC: 31.8 g/dL (ref 30.0–36.0)
MCV: 84.8 fL (ref 80.0–100.0)
Monocytes Absolute: 0.2 10*3/uL (ref 0.1–1.0)
Monocytes Relative: 4 %
Neutro Abs: 4.7 10*3/uL (ref 1.7–7.7)
Neutrophils Relative %: 79 %
Platelets: 270 10*3/uL (ref 150–400)
RBC: 3.82 MIL/uL — ABNORMAL LOW (ref 3.87–5.11)
RDW: 14.6 % (ref 11.5–15.5)
WBC: 5.9 10*3/uL (ref 4.0–10.5)
nRBC: 0 % (ref 0.0–0.2)

## 2022-08-26 LAB — COMPREHENSIVE METABOLIC PANEL
ALT: 18 U/L (ref 0–44)
AST: 22 U/L (ref 15–41)
Albumin: 3.7 g/dL (ref 3.5–5.0)
Alkaline Phosphatase: 103 U/L (ref 38–126)
Anion gap: 13 (ref 5–15)
BUN: 6 mg/dL (ref 6–20)
CO2: 20 mmol/L — ABNORMAL LOW (ref 22–32)
Calcium: 8.5 mg/dL — ABNORMAL LOW (ref 8.9–10.3)
Chloride: 100 mmol/L (ref 98–111)
Creatinine, Ser: 0.42 mg/dL — ABNORMAL LOW (ref 0.44–1.00)
GFR, Estimated: >60 mL/min (ref >60)
Glucose, Bld: 129 mg/dL — ABNORMAL HIGH (ref 70–99)
Potassium: 3.3 mmol/L — ABNORMAL LOW (ref 3.5–5.1)
Sodium: 133 mmol/L — ABNORMAL LOW (ref 135–145)
Total Bilirubin: 0.3 mg/dL (ref 0.3–1.2)
Total Protein: 7 g/dL (ref 6.5–8.1)

## 2022-08-26 LAB — PROTIME-INR
INR: 1 (ref 0.8–1.2)
Prothrombin Time: 13.5 seconds (ref 11.4–15.2)

## 2022-08-26 LAB — URINALYSIS, ROUTINE W REFLEX MICROSCOPIC
Bilirubin Urine: NEGATIVE
Glucose, UA: 50 mg/dL — AB
Hgb urine dipstick: NEGATIVE
Ketones, ur: 5 mg/dL — AB
Leukocytes,Ua: NEGATIVE
Nitrite: NEGATIVE
Protein, ur: NEGATIVE mg/dL
Specific Gravity, Urine: 1.009 (ref 1.005–1.030)
pH: 8 (ref 5.0–8.0)

## 2022-08-26 LAB — LIPASE, BLOOD: Lipase: 24 U/L (ref 11–51)

## 2022-08-26 LAB — ETHANOL: Alcohol, Ethyl (B): <10 mg/dL (ref <10)

## 2022-08-26 MED ORDER — DICYCLOMINE HCL 10 MG PO CAPS
10.0000 mg | ORAL_CAPSULE | Freq: Once | ORAL | Status: AC
  Administered 2022-08-26: 10 mg via ORAL
  Filled 2022-08-26: qty 1

## 2022-08-26 MED ORDER — ONDANSETRON HCL 4 MG/2ML IJ SOLN
4.0000 mg | Freq: Once | INTRAMUSCULAR | Status: AC
  Administered 2022-08-26: 4 mg via INTRAVENOUS
  Filled 2022-08-26: qty 2

## 2022-08-26 MED ORDER — SUCRALFATE 1 GM/10ML PO SUSP
1.0000 g | Freq: Once | ORAL | Status: AC
  Administered 2022-08-26: 1 g via ORAL
  Filled 2022-08-26: qty 10

## 2022-08-26 MED ORDER — SUCRALFATE 1 GM/10ML PO SUSP: 1.0000 g | Freq: Three times a day (TID) | ORAL | 1 refills | Status: AC

## 2022-08-26 MED ORDER — PANTOPRAZOLE SODIUM 20 MG PO TBEC: 20.0000 mg | DELAYED_RELEASE_TABLET | Freq: Every day | ORAL | 0 refills | Status: AC

## 2022-08-26 MED ORDER — LACTATED RINGERS IV SOLN: INTRAVENOUS | Status: DC

## 2022-08-26 MED ORDER — IOHEXOL 300 MG/ML  SOLN
100.0000 mL | Freq: Once | INTRAMUSCULAR | Status: AC | PRN
  Administered 2022-08-26: 100 mL via INTRAVENOUS

## 2022-08-26 MED ORDER — SODIUM CHLORIDE (PF) 0.9 % IJ SOLN
INTRAMUSCULAR | Status: AC
  Filled 2022-08-26: qty 50

## 2022-08-26 MED ORDER — DICYCLOMINE HCL 10 MG PO CAPS: 10.0000 mg | ORAL_CAPSULE | Freq: Four times a day (QID) | ORAL | 1 refills | Status: DC | PRN

## 2022-08-26 MED ORDER — HYDROMORPHONE HCL 1 MG/ML IJ SOLN
1.0000 mg | Freq: Once | INTRAMUSCULAR | Status: AC
  Administered 2022-08-26: 1 mg via INTRAVENOUS
  Filled 2022-08-26: qty 1

## 2022-08-26 NOTE — ED Triage Notes (Signed)
Per EMS Pt complaining of abd pain and N/V for apprx 4 days. 100 mcg of fentanyl last given at 0940.

## 2022-08-26 NOTE — ED Notes (Incomplete)
Patient being uncooperative and rude while this RN was attempting to IV access and blood work. Patient threaten to scream stating "Tell the doctor to get in here now or else I'm going to scream" This RN politely and respectfully asked the pt to not do that and also explained that we currently have one doctor is currently doing his rounds. Pt kee

## 2022-08-26 NOTE — ED Notes (Signed)
Pt husband informed registration staff that pt was having chest pain and arm pain, EKG and updated vitals taken in triage. Pt placed back in lobby after MD looked at EKG, vitals improved from initial triage.

## 2022-08-26 NOTE — ED Notes (Signed)
Patient being uncooperative and rude while this RN was attempting to obtain IV access and blood work. Patient threaten to scream stating "Tell the doctor to get in here now or else I'm going to scream" This RN politely and respectfully asked the patient to not do that and also explained that we currently have one doctor who is currently doing his rounds. Patient tossing and turing on the bed while attempting to get vitals. Patient took off BP cuff and Pulse Ox and is refusing vital signs at this time. This RN made sure that stretcher was locked and in the lowest position and both side rails were in the up position. This RN explained to both the Patient and family member who is at bedside the importance and safety of the side rails being up and encouraged them to use the call bell if they needed assistance with anything.

## 2022-08-26 NOTE — ED Notes (Signed)
Attempted to obtain Pt vital and Pt was moving around stated she couldn't at this time. Bed rail was down and family stated that they would make sure she didn't get out of the bed alone due to fall risk.

## 2022-08-26 NOTE — ED Notes (Signed)
Pt and husband inquired about wait times, told that exact wait times could not be given out but longest wait time currently is approx 2 hours. Pt asked to sign waiver for EMTALA and leaving before being seen, pt sts that she may leave to go back to [place], informed that all patients are asked to sign form even if they aren't planning on going somewhere else. Pt sts "don't get smart I'm going to sign the form", she then proceeded to aggressively grab esignature pad and pen out of this Rns hand. Did not start IV due to pt wanting to leave. Husband sts they will wait for now.

## 2022-08-26 NOTE — ED Provider Notes (Signed)
[place] EMERGENCY DEPARTMENT AT [facility] Provider Note   CSN: [id number] Arrival date & time: 08/26/22  4098     History  Chief Complaint  Patient presents with   Abdominal Pain    Sheryl Landry is a 62 y.o. female.  HPI type 2 diabetes, history of alcohol use disorder (no alcohol use for greater than 2 years), MDD, history of gastric bypass surgery about 24 years ago.  Patient reports she has had abdominal pain off and on this week.  Is been largely central in nature but somewhat migratory between upper and lower.  She denies radiation into the back.  She reports last night she had pain most of the night and ended up having dry heaves several times.  She reports she really did not bring much up.  She reports she had a normal bowel movement today.  She denies diarrhea or constipation.  Denies pain burning urgency with urination.  She reports she had something similar at the beginning of the year but symptoms resolved.  Notably patient was seen at that time in the emergency department March 2024.  At that time she was diagnosed with suspected pyelonephritis and was stable for outpatient treatment.  Patient is denying any GU symptoms at this time.    Home Medications Prior to Admission medications   Medication Sig Start Date End Date Taking? Authorizing Provider  dicyclomine (BENTYL) 10 MG capsule Take 1 capsule (10 mg total) by mouth every 6 (six) hours as needed for spasms. 08/26/22  Yes Arby Barrette, MD  pantoprazole (PROTONIX) 20 MG tablet Take 1 tablet (20 mg total) by mouth daily. 08/26/22  Yes Arby Barrette, MD  sucralfate (CARAFATE) 1 GM/10ML suspension Take 10 mLs (1 g total) by mouth 4 (four) times daily -  with meals and at bedtime. 08/26/22  Yes Arby Barrette, MD  aspirin 81 MG EC tablet Take 81 mg by mouth daily.    [provider]  buPROPion (WELLBUTRIN XL) 300 MG 24 hr tablet Take 300 mg by mouth every morning. 04/11/19   [provider]  cefpodoxime (VANTIN) 200 MG tablet Take 1 tablet (200 mg total) by mouth 2 (two) times daily. 04/17/22   Theron Arista, PA-C  Cyanocobalamin (VITAMIN B 12) 500 MCG TABS Take 500 mcg by mouth daily.    [provider]  metFORMIN (GLUCOPHAGE) 500 MG tablet Take 1 tablet (500 mg total) by mouth daily with breakfast. For diabetes management Patient taking differently: Take 1,000 mg by mouth every evening. 09/23/13   Sanjuana Kava, NP  Multiple Vitamin (MULTIVITAMIN WITH MINERALS) TABS tablet Take 1 tablet by mouth every evening.     [provider]  ondansetron (ZOFRAN) 4 MG tablet Take 1 tablet (4 mg total) by mouth every 6 (six) hours. 04/17/22   Theron Arista, PA-C  QUEtiapine (SEROQUEL) 400 MG tablet Take 400 mg by mouth at bedtime.    [provider]  venlafaxine XR (EFFEXOR-XR) 150 MG 24 hr capsule Take 300 mg by mouth at bedtime. 01/04/20   [provider]      Allergies    Nsaids and Cortisone    Review of Systems   Review of Systems  Physical Exam Updated Vital Signs BP (!) 180/90 (BP Location: Left Arm)   Pulse 70   Temp 98 F (36.7 C) (Oral)   Resp 16   Ht 5\' 2"  (1.575 m)   Wt 72.6 kg   SpO2 100%   BMI 29.26  kg/m  Physical Exam Constitutional:      Comments: Alert nontoxic.  Clear mental status.  No respiratory distress.  Uncomfortable in appearance.  HENT:     Mouth/Throat:     Pharynx: Oropharynx is clear.  Eyes:     Extraocular Movements: Extraocular movements intact.  Cardiovascular:     Rate and Rhythm: Normal rate and regular rhythm.  Pulmonary:     Effort: Pulmonary effort is normal.     Breath sounds: Normal breath sounds.  Abdominal:     Comments: [name] is pain to palpation diffusely.  No guarding.  Musculoskeletal:        General: No swelling or tenderness. Normal range of motion.     Right lower leg: No edema.     Left lower leg: No edema.  Skin:    General: Skin is warm and dry.  Neurological:      General: No focal deficit present.     Mental Status: She is oriented to person, place, and time.     Coordination: Coordination normal.     ED Results / Procedures / Treatments   Labs (all labs ordered are listed, but only abnormal results are displayed) Labs Reviewed  COMPREHENSIVE METABOLIC PANEL - Abnormal; Notable for the following components:      Result Value   Sodium 133 (*)    Potassium 3.3 (*)    CO2 20 (*)    Glucose, Bld 129 (*)    Creatinine, Ser 0.42 (*)    Calcium 8.5 (*)    All other components within normal limits  CBC WITH DIFFERENTIAL/PLATELET - Abnormal; Notable for the following components:   RBC 3.82 (*)    Hemoglobin 10.3 (*)    HCT 32.4 (*)    All other components within normal limits  URINALYSIS, ROUTINE W REFLEX MICROSCOPIC - Abnormal; Notable for the following components:   Color, Urine STRAW (*)    Glucose, UA 50 (*)    Ketones, ur 5 (*)    All other components within normal limits  ETHANOL  LIPASE, BLOOD  PROTIME-INR    EKG None  Radiology CT ABDOMEN PELVIS W CONTRAST  Result Date: 08/26/2022 CLINICAL DATA:  Abdominal pain, nausea and vomiting for 4 days EXAM: CT ABDOMEN AND PELVIS WITH CONTRAST TECHNIQUE: Multidetector CT imaging of the abdomen and pelvis was performed using the standard protocol following bolus administration of intravenous contrast. RADIATION DOSE REDUCTION: This exam was performed according to the departmental dose-optimization program which includes automated exposure control, adjustment of the mA and/or kV according to patient size and/or use of iterative reconstruction technique. CONTRAST:  OMNIPAQUE IOHEXOL 300 MG/ML  SOLN COMPARISON:  CT 04/17/2022 FINDINGS: Lower chest: There is some linear opacity lung bases likely scar or atelectasis. No pleural effusion. Left-sided breast implant identified. Hepatobiliary: Fatty liver infiltration. No space-occupying liver lesion. Previous cholecystectomy. Patent portal vein.  Pancreas: Unremarkable. No pancreatic ductal dilatation or surrounding inflammatory changes. Spleen: Normal in size without focal abnormality. Adrenals/Urinary Tract: The adrenal glands are preserved. There is some mild bilateral renal atrophy, left-greater-than-right. There are also some small cysts identified which are benign-appearing but several are less than a cm. Bosniak 1 and 2 lesions. No specific imaging follow-up. There is also some calcification along the upper pole of the left kidney anteriorly, unchanged from previous. Please correlate with any prior history. The ureters have normal course and caliber extending down to the bladder. Preserved contours of the urinary bladder. Stomach/Bowel: Large bowel has a normal  course and caliber. Scattered colonic stool. Normal appendix. Surgical changes from gastric bypass. The excluded stomach is underdistended but has some slight fold thickening. Unchanged from previous. The small bowel is nondilated. Vascular/Lymphatic: Aortic atherosclerosis. No enlarged abdominal or pelvic lymph nodes. Reproductive: Status post hysterectomy. No adnexal masses. Other: No free intra-air. Trace free fluid in the pelvis. Nonspecific. Musculoskeletal: Curvature of the spine. Scattered degenerative changes of the spine and pelvis. IMPRESSION: No bowel obstruction, free air or free fluid. Scattered stool. Normal appendix. Surgical changes from gastric bypass. Slight wall thickening of the excluded stomach, nonspecific. The stomach is underdistended. Fatty liver infiltration. Bilateral renal atrophy. Electronically Signed   By: Karen Kays M.D.   On: 08/26/2022 14:01    Procedures Procedures    Medications Ordered in ED Medications  lactated ringers infusion ( Intravenous New Bag/Given 08/26/22 1129)  dicyclomine (BENTYL) capsule 10 mg (has no administration in time range)  sucralfate (CARAFATE) 1 GM/10ML suspension 1 g (has no administration in time range)  ondansetron  (ZOFRAN) injection 4 mg (4 mg Intravenous Given 08/26/22 1130)  HYDROmorphone (DILAUDID) injection 1 mg (1 mg Intravenous Given 08/26/22 1130)  iohexol (OMNIPAQUE) 300 MG/ML solution 100 mL (100 mLs Intravenous Contrast Given 08/26/22 1250)    ED Course/ Medical Decision Making/ A&P                             Medical Decision Making Amount and/or Complexity of Data Reviewed Labs: ordered. Radiology: ordered.  Risk Prescription drug management.   Patient presents as outlined with waxing waning abdominal pain over the course of this week.  She does have history of gastric bypass surgery approximately 24 years ago.  She has had periodic abdominal pain.  At this time she is alert and no signs of sepsis.  Will treat for pain and initiate diagnostic workup including lab work and CT.  GFR greater than 60.  Normal LFTs.  Potassium slightly low at 3.3 sodium slightly low 133.  Urinalysis few ketones otherwise negative.  Lipase 24, white count 5.9 H&H 10.3 and 32.4.  Normal differential.  CT abdomen inter by radiology no acute findings.  Stable as compared to previous.  Recheck 14: 42 after pain medications patient looks much improved.  She is alert and appropriate.  She reports she still has some epigastric discomfort.  She has not been taking any PPIs recently.  She does have old prescriptions for Carafate, Zofran and dicyclomine which she reports was helpful at the time when she had a similar episode.  At this time I recommend patient start Protonix daily and Carafate for about a week with as needed dicyclomine.  Careful return precautions included in discharge instructions.  Patient has very distant history of gastric bypass but has not had any complications recently and at this time based on CT scan and patient's condition I have lower suspicion for acute complication.  However will need close follow-up and return if any worsening or changing symptoms.        Final Clinical Impression(s) /  ED Diagnoses Final diagnoses:  Generalized abdominal pain    Rx / DC Orders ED Discharge Orders          Ordered    sucralfate (CARAFATE) 1 GM/10ML suspension  3 times daily with meals & bedtime        08/26/22 1437    dicyclomine (BENTYL) 10 MG capsule  Every 6 hours PRN  08/26/22 1437    pantoprazole (PROTONIX) 20 MG tablet  Daily        08/26/22 1438              Arby Barrette, MD 08/26/22 1443

## 2022-08-26 NOTE — Discharge Instructions (Addendum)
1.  It is important that you follow-up with your doctor as soon as possible and possibly a gastroenterologist.  At this time your CT scan does not show any abnormalities however you may need additional evaluation such as an upper endoscopy. 2.  Start taking Protonix daily in the morning.  Follow instructions for gastritis as outlined in your discharge instructions.  You should also take Carafate 3-4 times a day for the next week as prescribed.  You may take dicyclomine as prescribed for cramping spasms of the abdomen. 3.  Return to the emergency department if your symptoms are persisting or worsening.

## 2022-08-26 NOTE — ED Triage Notes (Signed)
POV from home, A&O x 4, GCS 15, bib wheelchair  Dx with acid reflux at [place] earlier today, upper and lower abd pain currently. Unable to get to many answers in triage due to pt being uncomfortable.

## 2022-08-27 LAB — URINALYSIS, ROUTINE W REFLEX MICROSCOPIC
Bilirubin Urine: NEGATIVE
Glucose, UA: NEGATIVE mg/dL
Hgb urine dipstick: NEGATIVE
Ketones, ur: 15 mg/dL — AB
Leukocytes,Ua: NEGATIVE
Nitrite: NEGATIVE
Protein, ur: NEGATIVE mg/dL
Specific Gravity, Urine: 1.013 (ref 1.005–1.030)
pH: 8.5 — ABNORMAL HIGH (ref 5.0–8.0)

## 2022-08-27 MED ORDER — SODIUM CHLORIDE 0.9 % IV BOLUS
1000.0000 mL | Freq: Once | INTRAVENOUS | Status: AC
  Administered 2022-08-27: 1000 mL via INTRAVENOUS

## 2022-08-27 MED ORDER — ONDANSETRON HCL 4 MG/2ML IJ SOLN
4.0000 mg | Freq: Once | INTRAMUSCULAR | Status: AC
  Administered 2022-08-27: 4 mg via INTRAVENOUS
  Filled 2022-08-27: qty 2

## 2022-08-27 MED ORDER — HYDROMORPHONE HCL 1 MG/ML IJ SOLN
1.0000 mg | Freq: Once | INTRAMUSCULAR | Status: AC
  Administered 2022-08-27: 1 mg via INTRAVENOUS
  Filled 2022-08-27: qty 1

## 2022-08-27 MED ORDER — MORPHINE SULFATE (PF) 4 MG/ML IV SOLN
4.0000 mg | Freq: Once | INTRAVENOUS | Status: AC
  Administered 2022-08-27: 4 mg via INTRAVENOUS
  Filled 2022-08-27: qty 1

## 2022-08-27 NOTE — ED Provider Notes (Signed)
[place] EMERGENCY DEPARTMENT AT [facility] Provider Note   CSN: [id number] Arrival date & time: 08/26/22  2122     History  Chief Complaint  Patient presents with   Abdominal Pain    Sheryl Landry is a 62 y.o. female.  Patient is a 62 year old female with past medical history of generalized anxiety, panic attacks, aggressive behavior, alcohol abuse, and prior gastric bypass years ago.  Patient presenting today with complaints of severe abdominal pain.  She was seen at [facility] earlier today with the same complaint.  She underwent laboratory studies and CT scan of the abdomen and pelvis, but no definitive cause was found.  She was discharged with medications which she reports taking and have not helped.  Patient is moaning and writhing in the exam room and behaving quite dramatically.  The history is provided by the patient.       Home Medications Prior to Admission medications   Medication Sig Start Date End Date Taking? Authorizing Provider  aspirin 81 MG EC tablet Take 81 mg by mouth daily.    [provider]  buPROPion (WELLBUTRIN XL) 300 MG 24 hr tablet Take 300 mg by mouth every morning. 04/11/19   [provider]  cefpodoxime (VANTIN) 200 MG tablet Take 1 tablet (200 mg total) by mouth 2 (two) times daily. 04/17/22   Theron Arista, PA-C  Cyanocobalamin (VITAMIN B 12) 500 MCG TABS Take 500 mcg by mouth daily.    [provider]  dicyclomine (BENTYL) 10 MG capsule Take 1 capsule (10 mg total) by mouth every 6 (six) hours as needed for spasms. 08/26/22   Arby Barrette, MD  metFORMIN (GLUCOPHAGE) 500 MG tablet Take 1 tablet (500 mg total) by mouth daily with breakfast. For diabetes management Patient taking differently: Take 1,000 mg by mouth every evening. 09/23/13   Sanjuana Kava, NP  Multiple Vitamin (MULTIVITAMIN WITH MINERALS) TABS tablet Take 1 tablet by mouth every evening.     [provider]  ondansetron (ZOFRAN) 4 MG  tablet Take 1 tablet (4 mg total) by mouth every 6 (six) hours. 04/17/22   Theron Arista, PA-C  pantoprazole (PROTONIX) 20 MG tablet Take 1 tablet (20 mg total) by mouth daily. 08/26/22   Arby Barrette, MD  QUEtiapine (SEROQUEL) 400 MG tablet Take 400 mg by mouth at bedtime.    [provider]  sucralfate (CARAFATE) 1 GM/10ML suspension Take 10 mLs (1 g total) by mouth 4 (four) times daily -  with meals and at bedtime. 08/26/22   Arby Barrette, MD  venlafaxine XR (EFFEXOR-XR) 150 MG 24 hr capsule Take 300 mg by mouth at bedtime. 01/04/20   [provider]      Allergies    Nsaids and Cortisone    Review of Systems   Review of Systems  All other systems reviewed and are negative.   Physical Exam Updated Vital Signs BP 132/80   Pulse 60   Temp 97.9 F (36.6 C) (Oral)   Resp 20   Ht 5\' 2"  (1.575 m)   Wt 72.6 kg   SpO2 100%   BMI 29.27 kg/m  Physical Exam Vitals and nursing note reviewed.  Constitutional:      General: She is not in acute distress.    Appearance: She is well-developed. She is not diaphoretic.  HENT:     Head: Normocephalic and atraumatic.  Cardiovascular:     Rate and Rhythm: Normal rate and regular rhythm.  Heart sounds: No murmur heard.    No friction rub. No gallop.  Pulmonary:     Effort: Pulmonary effort is normal. No respiratory distress.     Breath sounds: Normal breath sounds. No wheezing.  Abdominal:     General: Bowel sounds are normal. There is no distension.     Palpations: Abdomen is soft.     Tenderness: There is abdominal tenderness in the epigastric area and suprapubic area. There is no right CVA tenderness, left CVA tenderness, guarding or rebound.  Musculoskeletal:        General: Normal range of motion.     Cervical back: Normal range of motion and neck supple.  Skin:    General: Skin is warm and dry.  Neurological:     General: No focal deficit present.     Mental Status: She is alert and oriented to person,  place, and time.     ED Results / Procedures / Treatments   Labs (all labs ordered are listed, but only abnormal results are displayed) Labs Reviewed  COMPREHENSIVE METABOLIC PANEL - Abnormal; Notable for the following components:      Result Value   Chloride 97 (*)    Glucose, Bld 134 (*)    Total Protein 9.1 (*)    Anion gap 17 (*)    All other components within normal limits  URINALYSIS, ROUTINE W REFLEX MICROSCOPIC - Abnormal; Notable for the following components:   Color, Urine COLORLESS (*)    pH 8.5 (*)    Ketones, ur 15 (*)    All other components within normal limits  LIPASE, BLOOD  CBC    EKG EKG Interpretation Date/Time:  Friday August 26 2022 21:53:20 EDT Ventricular Rate:  58 PR Interval:  168 QRS Duration:  84 QT Interval:  456 QTC Calculation: 447 R Axis:   -11  Text Interpretation: Sinus bradycardia Cannot rule out Anterior infarct , age undetermined Abnormal ECG When compared with ECG of 17-Apr-2022 15:58, PREVIOUS ECG IS PRESENT Confirmed by Geoffery Lyons ([id number]) on 08/27/2022 12:32:39 AM  Radiology CT ABDOMEN PELVIS W CONTRAST  Result Date: 08/26/2022 CLINICAL DATA:  Abdominal pain, nausea and vomiting for 4 days EXAM: CT ABDOMEN AND PELVIS WITH CONTRAST TECHNIQUE: Multidetector CT imaging of the abdomen and pelvis was performed using the standard protocol following bolus administration of intravenous contrast. RADIATION DOSE REDUCTION: This exam was performed according to the departmental dose-optimization program which includes automated exposure control, adjustment of the mA and/or kV according to patient size and/or use of iterative reconstruction technique. CONTRAST:  OMNIPAQUE IOHEXOL 300 MG/ML  SOLN COMPARISON:  CT 04/17/2022 FINDINGS: Lower chest: There is some linear opacity lung bases likely scar or atelectasis. No pleural effusion. Left-sided breast implant identified. Hepatobiliary: Fatty liver infiltration. No space-occupying liver lesion.  Previous cholecystectomy. Patent portal vein. Pancreas: Unremarkable. No pancreatic ductal dilatation or surrounding inflammatory changes. Spleen: Normal in size without focal abnormality. Adrenals/Urinary Tract: The adrenal glands are preserved. There is some mild bilateral renal atrophy, left-greater-than-right. There are also some small cysts identified which are benign-appearing but several are less than a cm. Bosniak 1 and 2 lesions. No specific imaging follow-up. There is also some calcification along the upper pole of the left kidney anteriorly, unchanged from previous. Please correlate with any prior history. The ureters have normal course and caliber extending down to the bladder. Preserved contours of the urinary bladder. Stomach/Bowel: Large bowel has a normal course and caliber. Scattered colonic stool. Normal appendix. Surgical changes  from gastric bypass. The excluded stomach is underdistended but has some slight fold thickening. Unchanged from previous. The small bowel is nondilated. Vascular/Lymphatic: Aortic atherosclerosis. No enlarged abdominal or pelvic lymph nodes. Reproductive: Status post hysterectomy. No adnexal masses. Other: No free intra-air. Trace free fluid in the pelvis. Nonspecific. Musculoskeletal: Curvature of the spine. Scattered degenerative changes of the spine and pelvis. IMPRESSION: No bowel obstruction, free air or free fluid. Scattered stool. Normal appendix. Surgical changes from gastric bypass. Slight wall thickening of the excluded stomach, nonspecific. The stomach is underdistended. Fatty liver infiltration. Bilateral renal atrophy. Electronically Signed   By: Karen Kays M.D.   On: 08/26/2022 14:01    Procedures Procedures    Medications Ordered in ED Medications  sodium chloride 0.9 % bolus 1,000 mL (has no administration in time range)  ondansetron (ZOFRAN) injection 4 mg (has no administration in time range)  morphine (PF) 4 MG/ML injection 4 mg (has no  administration in time range)    ED Course/ Medical Decision Making/ A&P  Patient presenting with complaints of abdominal pain as described in the HPI.  She was seen earlier today at [facility] with similar complaints where laboratory studies and CT scan were unremarkable.  She was discharged with medications, however pain returned and she presents again here.  Vitals are stable and patient is afebrile.  Workup initiated including CBC, CMP, lipase, and urinalysis.  All studies unremarkable.  Patient has been given IV fluids along with morphine and Zofran followed by Dilaudid and now is resting comfortably.  Patient to be discharged with as needed follow-up/return.  Cause of her abdominal pain unclear, however nothing appears emergent.  Final Clinical Impression(s) / ED Diagnoses Final diagnoses:  None    Rx / DC Orders ED Discharge Orders     None         Geoffery Lyons, MD 08/27/22 [phone]

## 2022-08-27 NOTE — ED Notes (Signed)
All appropriate discharge materials reviewed at length with patient. Time for questions provided. Pt has no other questions at this time and verbalizes understanding of all provided materials.  

## 2022-08-27 NOTE — Discharge Instructions (Signed)
Take medications as previously prescribed.  Follow-up with primary doctor if not improving in the next few days.

## 2022-08-27 NOTE — ED Notes (Signed)
Patient husband is at bedside and will be the one driving.

## 2022-08-27 NOTE — ED Notes (Signed)
Patient resting. No distress noted at this time. Stretcher locked and in lowest position. Both side rails are up.

## 2022-08-27 NOTE — ED Notes (Signed)
Patient ambulated with steady gait to bathroom and c/o abdominal pain still being present. EDP has been notified.

## 2022-10-27 ENCOUNTER — Other Ambulatory Visit: Payer: Self-pay | Admitting: Gastroenterology

## 2022-10-27 DIAGNOSIS — R1010 Upper abdominal pain, unspecified: Secondary | ICD-10-CM

## 2022-12-11 ENCOUNTER — Other Ambulatory Visit: Payer: 59

## 2023-03-29 ENCOUNTER — Encounter: Payer: Self-pay | Admitting: Gastroenterology

## 2023-04-03 NOTE — Progress Notes (Addendum)
  Cardiology Office Note:  .   Date:  04/04/2023  ID:  Sheryl Landry, DOB 1960/11/21, MRN [id number] PCP: Daisy Floro, MD  [facility] Providers Cardiologist:  None {  History of Present Illness: .   Sheryl Landry is a 63 y.o. 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  No CP walking up and down the stairs   CP may last for 45 minutes - 1 hour  Resolves spontaneously   Now is having these episodes 3-4 times a week  Sometimes wakes her up at night   Avoids salt for the most part  Eats bacon and sausage occasionally   Walks around the neighborhood occasionally    Has lots of stomach   Non smoker  No ETOH   Fam. Hx  Mother had HTN   ROS:   Studies Reviewed: Marland Kitchen   EKG Interpretation Date/Time:  Tuesday April 04 2023 16:17:16 EST Ventricular Rate:  64 PR Interval:  190 QRS Duration:  90 QT Interval:  400 QTC Calculation: 412 R Axis:   -61  Text Interpretation: Normal sinus rhythm Left axis deviation Cannot rule out Anterior infarct (cited on or before 26-Aug-2022) When compared with ECG of 26-Aug-2022 21:53, No significant change was found Confirmed by Kristeen Miss ([id number]) on 04/04/2023 4:54:35 PM     Risk Assessment/Calculations:         Physical Exam:   VS:  BP (!) 148/84   Pulse 64   Ht 5\' 2"  (1.575 m)   Wt 152 lb 12.8 oz (69.3 kg)   SpO2 98%   BMI 27.95 kg/m    Wt Readings from Last 3 Encounters:  04/04/23 152 lb 12.8 oz (69.3 kg)  08/26/22 160 lb 0.9 oz (72.6 kg)  08/26/22 160 lb (72.6 kg)    GEN: Well nourished, well developed in no acute distress NECK: No JVD; No carotid bruits CARDIAC: RRR, no murmurs, rubs, gallops RESPIRATORY:  Clear to auscultation without rales, wheezing or rhonchi  ABDOMEN: Soft, non-tender, non-distended EXTREMITIES:  No edema; No deformity   ASSESSMENT AND PLAN: .     Chest pain :   somewhat atypical .  Has had some nonspecific ST abnormalities on ECG  Will schedule her for a coronary CTA for further evaluation   She is on an ASA 81 mg a day        2.  HTN:   will have her reduce her intake of processed meats  Needs to exercise more    Dispo: PRN   Signed, Kristeen Miss, MD

## 2023-04-04 ENCOUNTER — Encounter: Payer: Self-pay | Admitting: Cardiovascular Disease

## 2023-04-04 ENCOUNTER — Ambulatory Visit: Payer: 59 | Attending: Cardiovascular Disease | Admitting: Cardiovascular Disease

## 2023-04-04 VITALS — BP 148/84 | HR 64 | Ht 62.0 in | Wt 152.8 lb

## 2023-04-04 DIAGNOSIS — Z01810 Encounter for preprocedural cardiovascular examination: Secondary | ICD-10-CM

## 2023-04-04 DIAGNOSIS — R079 Chest pain, unspecified: Secondary | ICD-10-CM

## 2023-04-04 DIAGNOSIS — I200 Unstable angina: Secondary | ICD-10-CM

## 2023-04-04 MED ORDER — METOPROLOL TARTRATE 50 MG PO TABS: ORAL_TABLET | ORAL | 0 refills | Status: AC

## 2023-04-04 NOTE — Patient Instructions (Addendum)
 Lab Work: BMET in 1 week If you have labs (blood work) drawn today and your tests are completely normal, you will receive your results only by: [employer] (if you have MyChart) OR A paper copy in the mail If you have any lab test that is abnormal or we need to change your treatment, we will call you to review the results.  Testing/Procedures: Coronary CT Angiogram Your physician has requested that you have cardiac CT. Cardiac computed tomography (CT) is a painless test that uses an x-ray machine to take clear, detailed pictures of your heart. For further information please visit [email or web address] Please follow instruction sheet as given.  Follow-Up: At [facility], you and your health needs are our priority.  As part of our continuing mission to provide you with exceptional heart care, we have created designated Provider Care Teams.  These Care Teams include your primary Cardiologist (physician) and Advanced Practice Providers (APPs -  Physician Assistants and Nurse Practitioners) who all work together to provide you with the care you need, when you need it.  We recommend signing up for the patient portal called "MyChart".  Sign up information is provided on this After Visit Summary.  MyChart is used to connect with patients for Virtual Visits (Telemedicine).  Patients are able to view lab/test results, encounter notes, upcoming appointments, etc.  Non-urgent messages can be sent to your provider as well.   To learn more about what you can do with MyChart, go to [email or web address].    Your next appointment:   As Needed  Provider:   Kristeen Miss, MD     Your cardiac CT will be scheduled at:   [facility] [address] [place], [place] [address] [phone] Please arrive at the [facility] and Children's Entrance (Entrance C2) of [facility] 30 minutes prior to test start time. You can use the FREE valet parking offered at entrance C  (encouraged to control the heart rate for the test)  Proceed to the [facility] Radiology Department (first floor) to check-in and test prep.  All radiology patients and guests should use entrance C2 at [facility], accessed from [facility], even though the hospital's physical address listed is [address]    Please follow these instructions carefully (unless otherwise directed): An IV will be required for this test and Nitroglycerin will be given.  On the Night Before the Test: Be sure to Drink plenty of water. Do not consume any caffeinated/decaffeinated beverages or chocolate 12 hours prior to your test. Do not take any antihistamines 12 hours prior to your test. On the Day of the Test: Drink plenty of water until 1 hour prior to the test. Do not eat any food 1 hour prior to test. You may take your regular medications prior to the test.  Take metoprolol (Lopressor) two hours prior to test. If you take Furosemide/Hydrochlorothiazide/Spironolactone/Chlorthalidone, please HOLD on the morning of the test. Patients who wear a continuous glucose monitor MUST remove the device prior to scanning. FEMALES- please wear underwire-free bra if available, avoid dresses  After the Test: Drink plenty of water. After receiving IV contrast, you may experience a mild flushed feeling. This is normal. On occasion, you may experience a mild rash up to 24 hours after the test. This is not dangerous. If this occurs, you can take Benadryl 25 mg, Zyrtec, Claritin, or Allegra and increase your fluid intake. (Patients taking Tikosyn should avoid Benadryl, and may take Zyrtec, Claritin,  or Allegra) If you experience trouble breathing, this can be serious. If it is severe call 911 IMMEDIATELY. If it is mild, please call our office. We will call to schedule your test 2-4 weeks out understanding that some insurance companies will need an authorization prior to the service being  performed.  For more information and frequently asked questions, please visit our website : [email or web address] For non-scheduling related questions, please contact the cardiac imaging nurse navigator should you have any questions/concerns: Cardiac Imaging Nurse Navigators Direct Office Dial: [phone]  For scheduling needs, including cancellations and rescheduling, please call [place], [phone].

## 2023-04-05 ENCOUNTER — Ambulatory Visit
Admission: RE | Admit: 2023-04-05 | Discharge: 2023-04-05 | Disposition: A | Discharge status: Home / Self Care | Payer: 59 | Source: Ambulatory Visit | Attending: Gastroenterology | Admitting: Gastroenterology

## 2023-04-05 DIAGNOSIS — R1010 Upper abdominal pain, unspecified: Secondary | ICD-10-CM

## 2023-04-05 MED ORDER — GADOPICLENOL 0.5 MMOL/ML IV SOLN
8.0000 mL | Freq: Once | INTRAVENOUS | Status: AC | PRN
  Administered 2023-04-05: 8 mL via INTRAVENOUS

## 2023-04-20 ENCOUNTER — Ambulatory Visit (HOSPITAL_COMMUNITY)

## 2023-05-11 ENCOUNTER — Institutional Professional Consult (permissible substitution): Payer: 59 | Admitting: Neurology

## 2023-05-11 ENCOUNTER — Encounter: Payer: Self-pay | Admitting: Neurology

## 2023-05-25 ENCOUNTER — Institutional Professional Consult (permissible substitution): Admitting: Plastic Surgery

## 2023-06-03 ENCOUNTER — Ambulatory Visit (INDEPENDENT_AMBULATORY_CARE_PROVIDER_SITE_OTHER): Admitting: Radiology

## 2023-06-03 ENCOUNTER — Ambulatory Visit
Admission: EM | Admit: 2023-06-03 | Discharge: 2023-06-03 | Disposition: A | Discharge status: Home / Self Care | Attending: Physician Assistant | Admitting: Physician Assistant

## 2023-06-03 ENCOUNTER — Other Ambulatory Visit: Payer: Self-pay

## 2023-06-03 DIAGNOSIS — R058 Other specified cough: Secondary | ICD-10-CM

## 2023-06-03 DIAGNOSIS — R0602 Shortness of breath: Secondary | ICD-10-CM

## 2023-06-03 DIAGNOSIS — J069 Acute upper respiratory infection, unspecified: Secondary | ICD-10-CM | POA: Diagnosis not present

## 2023-06-03 LAB — POC COVID19/FLU A&B COMBO
Covid Antigen, POC: NEGATIVE
Influenza A Antigen, POC: NEGATIVE
Influenza B Antigen, POC: NEGATIVE

## 2023-06-03 LAB — POCT RAPID STREP A (OFFICE): Rapid Strep A Screen: NEGATIVE

## 2023-06-03 MED ORDER — ACETAMINOPHEN 325 MG PO TABS
650.0000 mg | ORAL_TABLET | Freq: Once | ORAL | Status: AC
  Administered 2023-06-03: 650 mg via ORAL

## 2023-06-03 MED ORDER — PROMETHAZINE-DM 6.25-15 MG/5ML PO SYRP: 5.0000 mL | ORAL_SOLUTION | Freq: Four times a day (QID) | ORAL | 0 refills | Status: DC | PRN

## 2023-06-03 MED ORDER — ALBUTEROL SULFATE HFA 108 (90 BASE) MCG/ACT IN AERS: 1.0000 | INHALATION_SPRAY | Freq: Four times a day (QID) | RESPIRATORY_TRACT | 0 refills | Status: DC | PRN

## 2023-06-03 MED ORDER — AMOXICILLIN-POT CLAVULANATE 875-125 MG PO TABS: 1.0000 | ORAL_TABLET | Freq: Two times a day (BID) | ORAL | 0 refills | Status: DC

## 2023-06-03 NOTE — ED Triage Notes (Signed)
 Pt presents with complaints of productive cough x 2 weeks. Pt states her mucus has a dark green discoloration to it. Pt is also reporting nasal congestion, bilateral ear pain, and a sore throat. Pt currently rates her overall pain a 7/10. Pt states she had a fever of 102 F last night 5/2. Denies sick contacts. Denies taking OTC medications PTA.

## 2023-06-03 NOTE — ED Notes (Addendum)
 This RN reviewed pt's AVS at this time. Pt is requesting codeine cough syrup. Erin PA made aware.

## 2023-06-03 NOTE — Discharge Instructions (Signed)
 He was seen today for concerns of persistent productive cough, fever, chills, sore throat, body aches.  We have provided you with Tylenol  650 mg here in clinic to assist with your aches and pains.  Your flu, COVID, strep test were all negative.  Your chest x-ray did not show signs of an acute cardiopulmonary disease such as pneumonia.  At this time I suspect that you have an upper respiratory tract infection but since that has been ongoing for so long I am worried that it has become a bacterial infection.  I have sent in an antibiotic called Augmentin for you to take twice per day for 7 days.  Please finish the entire course of the antibiotic unless instructed to stop by a medical provider or if you develop an allergic reaction.  If you develop an allergic reaction please go to the emergency room for evaluation. You can take over-the-counter medications as needed for symptomatic relief.  I have outlined those below for you to review.  I have sent in a cough medication for you to take called promethazine -dextromethorphan.  This is a medication that has Robitussin as well as a antiemetic to help with your nausea and vomiting.  Please take this as directed as it can cause drowsiness so do not take it if you need to drive or remain alert. Per your request have also sent in a refill of your albuterol inhaler to assist with your breathing and shortness of breath.  You can use this up to every 6 hours as needed for coughing and chest tightness.  If at any point you start to develop fever or chills that are not responding to Tylenol , severe difficulty breathing, chest pain, passing out, confusion please go to the emergency room or call EMS for assistance as this could be signs of medical emergency.   The goal of treatment at this time is to reduce your symptoms and discomfort   I recommend using Robitussin and Mucinex (regular formulations, nothing with decongestants or DM)  You can also use Tylenol  for body aches  and fever reduction I also recommend adding an antihistamine to your daily regimen This includes medications like Claritin, Allegra, Zyrtec- the generics of these work very well and are usually less expensive I recommend using Flonase nasal spray - 2 puffs twice per day to help with your nasal congestion The antihistamines and Flonase can take a few weeks to provide significant relief from allergy symptoms but should start to provide some benefit soon. You can use a humidifier at night to help with preventing nasal dryness and irritation   If your symptoms are not improving or seem to be getting worse over the next 5 to 7 days you can always return here to urgent care or you can follow-up with your primary care provider for ongoing management Go to the ER if you begin to have more serious symptoms such as shortness of breath, trouble breathing, loss of consciousness, swelling around the eyes, high fever, severe lasting headaches, vision changes or neck pain/stiffness.

## 2023-06-03 NOTE — ED Provider Notes (Signed)
 [name]    CSN: [id number] Arrival date & time: 06/03/23  1041      History   Chief Complaint Chief Complaint  Patient presents with   Cough    HPI Sheryl Landry is a 63 y.o. female.   HPI  She reports she has had productive cough, wheezing, myalgias, fatigue, ear pain, headaches She states she has been coughing for about 2 weeks She reports other symptoms have been ongoing for about a week She reports having a fever of 102 last night - tried to eat ice to help with this   She has not taken any OTC medications so far  She reports previous hx of asthma and recurrent pneumonia   Past Medical History:  Diagnosis Date   Alcohol  abuse    Anxiety    Carpal tunnel syndrome, right    History of alcohol  use    12-15-2020  per pt quick alcohol  03/ 2022, however documented in epic 06-29-2020 pt positive elevated  alcohol  level   History of breast cancer    History of eye disorder 05/2019   per pt had episode double vision, evaluation by neurologist , Dr Gracie Lav note in epic strabismus vs 6th nerve palsy;  per pt symptoms resolved   History of left breast cancer 1999   s/p left mastectomy and completed chemo   Hypercholesterolemia    MDD (major depressive disorder)    Sleep disturbance    Type 2 diabetes mellitus (HCC)    followed by pcp  (12-15-2020 pt stated checks blood sugar every other day fasting, average sugar 77-100)   Wears glasses     Patient Active Problem List   Diagnosis Date Noted   Lesion of ulnar nerve, right upper limb 10/19/2020   Carpal tunnel syndrome of right wrist 10/02/2020   Laceration of right little finger 10/02/2020   Aggressive behavior 01/11/2020   Diplopia 05/03/2019   Generalized anxiety disorder 09/19/2013   Panic attacks 09/19/2013   Homicidal ideation 09/18/2013   Alcohol  abuse 09/17/2013   S/P alcohol  detoxification 09/17/2013    Past Surgical History:  Procedure Laterality Date   BIOPSY  03/12/2018    Procedure: BIOPSY;  Surgeon: Juanita Norlander, MD;  Location: [facility];  Service: General;;   CARPAL TUNNEL RELEASE Right 12/17/2020   Procedure: Right carpal tunnel release and cubital tunnel release guyons canal release;  Surgeon: Ltanya Rummer, MD;  Location: [facility];  Service: Orthopedics;  Laterality: Right;  with MAC   COLONOSCOPY WITH ESOPHAGOGASTRODUODENOSCOPY (EGD)  08/2020   per pt @ [facility]   ESOPHAGOGASTRODUODENOSCOPY (EGD) WITH PROPOFOL  N/A 03/12/2018   Procedure: ESOPHAGOGASTRODUODENOSCOPY (EGD) WITH PROPOFOL ;  Surgeon: Juanita Norlander, MD;  Location: [facility];  Service: General;  Laterality: N/A;   LAPAROSCOPIC INTERNAL HERNIA REPAIR  08/22/2005   @[facility] ;  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   MASTECTOMY Left 1999   REDUCTION MAMMAPLASTY     ROUX-EN-Y GASTRIC BYPASS  10/07/2003   @[facility] ;   laparoscopic approach w/ cholecystectomy   TOTAL ABDOMINAL HYSTERECTOMY W/ BILATERAL SALPINGOOPHORECTOMY  07/26/1999   @[facility]     OB History   No obstetric history on file.      Home Medications    Prior to Admission medications   Medication Sig Start Date End Date Taking? Authorizing Provider  albuterol (VENTOLIN HFA) 108 (90 Base) MCG/ACT inhaler Inhale 1-2 puffs into the lungs every 6 (six) hours as needed for wheezing or shortness of breath. 06/03/23  Yes Ketura Sirek, [place]  E, PA-C  amoxicillin-clavulanate (AUGMENTIN) 875-125 MG tablet Take 1 tablet by mouth every 12 (twelve) hours. 06/03/23  Yes Kiley Torrence E, PA-C  promethazine -dextromethorphan (PROMETHAZINE -DM) 6.25-15 MG/5ML syrup Take 5 mLs by mouth 4 (four) times daily as needed for cough. 06/03/23  Yes Greidys Deland E, PA-C  aspirin  81 MG EC tablet Take 81 mg by mouth daily.    [provider]  metoprolol  tartrate (LOPRESSOR ) 50 MG tablet Take 1 tablet by mouth two hours prior to scan 04/04/23   Nahser, Lela Purple, MD  Multiple Vitamin (MULTIVITAMIN WITH MINERALS) TABS tablet Take 1 tablet by mouth every  evening.     [provider]  pantoprazole  (PROTONIX ) 20 MG tablet Take 1 tablet (20 mg total) by mouth daily. 08/26/22   Wynetta Heckle, MD  QUEtiapine  (SEROQUEL ) 400 MG tablet Take 400 mg by mouth at bedtime.    [provider]  sucralfate  (CARAFATE ) 1 GM/10ML suspension Take 10 mLs (1 g total) by mouth 4 (four) times daily -  with meals and at bedtime. 08/26/22   Wynetta Heckle, MD  venlafaxine  XR (EFFEXOR -XR) 150 MG 24 hr capsule Take 300 mg by mouth at bedtime. 01/04/20   [provider]    Family History Family History  Problem Relation Age of Onset   Diabetes Father    Heart disease Father    Dementia Father    Diabetes type II Mother    Hypertension Mother    Stroke Mother     Social History Social History   Tobacco Use   Smoking status: Former    Current packs/day: 0.00    Types: Cigarettes    Start date: 06/04/2018    Quit date: 12/03/2018    Years since quitting: 4.5   Smokeless tobacco: Never   Tobacco comments:    2-3 cigarettes per day.  Vaping Use   Vaping status: Never Used  Substance Use Topics   Alcohol  use: Not Currently    Comment: hx alcohol  use  (12-15-2020  per pt stopped alcohol  03/ 2022)   Drug use: No     Allergies   Nsaids and Cortisone   Review of Systems Review of Systems  Constitutional:  Positive for chills, fatigue and fever.  HENT:  Positive for congestion, ear pain, rhinorrhea and sore throat.   Respiratory:  Positive for cough, shortness of breath and wheezing.   Gastrointestinal:  Positive for nausea and vomiting. Negative for diarrhea.  Musculoskeletal:  Positive for myalgias.  Neurological:  Positive for headaches.     Physical Exam Triage Vital Signs ED Triage Vitals  Encounter Vitals Group     BP 06/03/23 1048 128/83     Systolic BP Percentile --      Diastolic BP Percentile --      Pulse Rate 06/03/23 1048 69     Resp 06/03/23 1048 18     Temp 06/03/23 1048 99 F (37.2 C)     Temp Source  06/03/23 1048 Oral     SpO2 06/03/23 1048 98 %     Weight 06/03/23 1048 141 lb (64 kg)     Height 06/03/23 1048 5\' 2"  (1.575 m)     Head Circumference --      Peak Flow --      Pain Score 06/03/23 1100 7     Pain Loc --      Pain Education --      Exclude from Growth Chart --    No data found.  Updated  Vital Signs BP 128/83 (BP Location: Right Arm)   Pulse 69   Temp 99 F (37.2 C) (Oral)   Resp 18   Ht 5\' 2"  (1.575 m)   Wt 141 lb (64 kg)   SpO2 98%   BMI 25.79 kg/m   Visual Acuity Right Eye Distance:   Left Eye Distance:   Bilateral Distance:    Right Eye Near:   Left Eye Near:    Bilateral Near:     Physical Exam Vitals reviewed.  Constitutional:      General: She is awake.     Appearance: Normal appearance. She is well-developed and well-groomed.  HENT:     Head: Normocephalic and atraumatic.     Right Ear: Hearing, tympanic membrane and ear canal normal.     Left Ear: Hearing, tympanic membrane and ear canal normal.     Mouth/Throat:     Lips: Pink.     Mouth: Mucous membranes are moist.     Pharynx: Oropharynx is clear. Uvula midline. No pharyngeal swelling, oropharyngeal exudate, posterior oropharyngeal erythema, uvula swelling or postnasal drip.  Cardiovascular:     Rate and Rhythm: Normal rate and regular rhythm.     Pulses: Normal pulses.          Radial pulses are 2+ on the right side and 2+ on the left side.     Heart sounds: Normal heart sounds. No murmur heard.    No friction rub. No gallop.  Pulmonary:     Effort: Pulmonary effort is normal.     Breath sounds: Decreased air movement present. Decreased breath sounds present. No wheezing, rhonchi or rales.  Musculoskeletal:     Cervical back: Normal range of motion and neck supple.  Lymphadenopathy:     Head:     Right side of head: No submental, submandibular or preauricular adenopathy.     Left side of head: No submental, submandibular or preauricular adenopathy.     Cervical:     Right  cervical: No superficial cervical adenopathy.    Left cervical: No superficial cervical adenopathy.     Upper Body:     Right upper body: No supraclavicular adenopathy.     Left upper body: No supraclavicular adenopathy.  Neurological:     General: No focal deficit present.     Mental Status: She is alert and oriented to person, place, and time.  Psychiatric:        Mood and Affect: Mood normal.        Behavior: Behavior normal. Behavior is cooperative.        Thought Content: Thought content normal.        Judgment: Judgment normal.      UC Treatments / Results  Labs (all labs ordered are listed, but only abnormal results are displayed) Labs Reviewed  POC COVID19/FLU A&B COMBO  POCT RAPID STREP A (OFFICE)    EKG   Radiology DG Chest 2 View Result Date: 06/03/2023 CLINICAL DATA:  63 year old female with shortness of breath. EXAM: CHEST - 2 VIEW COMPARISON:  Chest radiographs 02/04/2013 and earlier. FINDINGS: PA and lateral views 1131 hours. Increased AP dimension to the chest. Larger lung volumes since 2011. Mild tortuosity of the thoracic aorta. Other mediastinal contours are within normal limits. Chronic postoperative changes to the left axilla and chest wall, left breast reconstruction. No pneumothorax, pulmonary edema, pleural effusion or confluent lung opacity. Visualized tracheal air column is within normal limits. No acute osseous abnormality identified. Stable cholecystectomy clips.  Negative visible bowel gas. IMPRESSION: Questionable pulmonary hyperinflation, increased since 2011. No acute cardiopulmonary abnormality. Electronically Signed   By: Marlise Simpers M.D.   On: 06/03/2023 11:37    Procedures Procedures (including critical care time)  Medications Ordered in UC Medications  acetaminophen  (TYLENOL ) tablet 650 mg (650 mg Oral Given 06/03/23 1132)    Initial Impression / Assessment and Plan / UC Course  I have reviewed the triage vital signs and the nursing  notes.  Pertinent labs & imaging results that were available during my care of the patient were reviewed by me and considered in my medical decision making (see chart for details).      Final Clinical Impressions(s) / UC Diagnoses   Final diagnoses:  SOB (shortness of breath)  Productive cough  Upper respiratory tract infection, unspecified type   Patient presents today with concerns of productive cough, nasal congestion, ear pain and sore throat that is been ongoing for about 2 weeks.  She reports having a fever last night but has not taken any medications over-the-counter for symptoms.  Physical exam is notable for decreased air movement and some decreased breath sounds globally.  She does have mildly elevated temperature today.  We did administer Tylenol  650 mg p.o. in clinic to assist with discomfort.  Rapid flu, strep, COVID test were negative.  These results were discussed with patient during her appointment.  Chest x-ray was negative for signs of acute cardiopulmonary disease but does show what appears to be chronic pulmonary hyperinflation since 2011.  Since symptoms have been ongoing for so long and I suspect she has underlying pulmonary disease we will go ahead and send in prescription for Augmentin p.o. twice daily x 7 days.  Will also send in albuterol rescue inhaler to assist with chest tightness and shortness of breath.  Patient does report concerns for coughing so we will send in promethazine -dextromethorphan cough syrup to assist with coughing and nausea.  Recommend continued use of over-the-counter medications as needed for symptomatic relief.  ED and return precautions reviewed and provided in after visit summary.  Follow-up as needed.    Discharge Instructions      He was seen today for concerns of persistent productive cough, fever, chills, sore throat, body aches.  We have provided you with Tylenol  650 mg here in clinic to assist with your aches and pains.  Your flu, COVID,  strep test were all negative.  Your chest x-ray did not show signs of an acute cardiopulmonary disease such as pneumonia.  At this time I suspect that you have an upper respiratory tract infection but since that has been ongoing for so long I am worried that it has become a bacterial infection.  I have sent in an antibiotic called Augmentin for you to take twice per day for 7 days.  Please finish the entire course of the antibiotic unless instructed to stop by a medical provider or if you develop an allergic reaction.  If you develop an allergic reaction please go to the emergency room for evaluation. You can take over-the-counter medications as needed for symptomatic relief.  I have outlined those below for you to review.  I have sent in a cough medication for you to take called promethazine -dextromethorphan.  This is a medication that has Robitussin as well as a antiemetic to help with your nausea and vomiting.  Please take this as directed as it can cause drowsiness so do not take it if you need to drive or remain alert.  Per your request have also sent in a refill of your albuterol inhaler to assist with your breathing and shortness of breath.  You can use this up to every 6 hours as needed for coughing and chest tightness.  If at any point you start to develop fever or chills that are not responding to Tylenol , severe difficulty breathing, chest pain, passing out, confusion please go to the emergency room or call EMS for assistance as this could be signs of medical emergency.   The goal of treatment at this time is to reduce your symptoms and discomfort   I recommend using Robitussin and Mucinex (regular formulations, nothing with decongestants or DM)  You can also use Tylenol  for body aches and fever reduction I also recommend adding an antihistamine to your daily regimen This includes medications like Claritin, Allegra, Zyrtec- the generics of these work very well and are usually less expensive I  recommend using Flonase nasal spray - 2 puffs twice per day to help with your nasal congestion The antihistamines and Flonase can take a few weeks to provide significant relief from allergy symptoms but should start to provide some benefit soon. You can use a humidifier at night to help with preventing nasal dryness and irritation   If your symptoms are not improving or seem to be getting worse over the next 5 to 7 days you can always return here to urgent care or you can follow-up with your primary care provider for ongoing management Go to the ER if you begin to have more serious symptoms such as shortness of breath, trouble breathing, loss of consciousness, swelling around the eyes, high fever, severe lasting headaches, vision changes or neck pain/stiffness.       ED Prescriptions     Medication Sig Dispense Auth. Provider   promethazine -dextromethorphan (PROMETHAZINE -DM) 6.25-15 MG/5ML syrup Take 5 mLs by mouth 4 (four) times daily as needed for cough. 118 mL Kymberlee Viger E, PA-C   amoxicillin-clavulanate (AUGMENTIN) 875-125 MG tablet Take 1 tablet by mouth every 12 (twelve) hours. 14 tablet Kobie Matkins E, PA-C   albuterol (VENTOLIN HFA) 108 (90 Base) MCG/ACT inhaler Inhale 1-2 puffs into the lungs every 6 (six) hours as needed for wheezing or shortness of breath. 8 g Chalyn Amescua E, PA-C      PDMP not reviewed this encounter.   Jerona Mooring, PA-C 06/03/23 1537

## 2023-06-10 ENCOUNTER — Encounter: Payer: Self-pay | Admitting: Emergency Medicine

## 2023-06-10 ENCOUNTER — Ambulatory Visit
Admission: EM | Admit: 2023-06-10 | Discharge: 2023-06-10 | Disposition: A | Discharge status: Home / Self Care | Attending: Internal Medicine | Admitting: Internal Medicine

## 2023-06-10 DIAGNOSIS — R051 Acute cough: Secondary | ICD-10-CM | POA: Diagnosis not present

## 2023-06-10 DIAGNOSIS — J209 Acute bronchitis, unspecified: Secondary | ICD-10-CM

## 2023-06-10 DIAGNOSIS — R0602 Shortness of breath: Secondary | ICD-10-CM

## 2023-06-10 MED ORDER — PREDNISONE 20 MG PO TABS: 40.0000 mg | ORAL_TABLET | Freq: Every day | ORAL | 0 refills | Status: DC

## 2023-06-10 MED ORDER — METHYLPREDNISOLONE 4 MG PO TBPK: ORAL_TABLET | ORAL | 0 refills | Status: AC

## 2023-06-10 MED ORDER — ALBUTEROL SULFATE HFA 108 (90 BASE) MCG/ACT IN AERS: 1.0000 | INHALATION_SPRAY | Freq: Four times a day (QID) | RESPIRATORY_TRACT | 0 refills | Status: DC | PRN

## 2023-06-10 NOTE — ED Triage Notes (Signed)
 Pt c/o cough, fatigue, ear pain, and back pain. She was seen on 5/3 and given abx and inhaler. States she has almost used up inhaler and is not feeling any better.

## 2023-06-10 NOTE — Discharge Instructions (Addendum)
 You have bronchitis which is inflammation of the upper airways in your lungs due to a virus.   We will treat this with steroids. Do not take any NSAIDs with steroid pills (no ibuprofen, naproxen while taking steroid, this could cause stomach upset).   Use albuterol every 4-6 hours as needed for cough, shortness of breath, and wheezing.   If you develop any new or worsening symptoms or if your symptoms do not start to improve, please return here or follow-up with your primary care provider. If your symptoms are severe, please go to the emergency room.

## 2023-06-10 NOTE — ED Provider Notes (Signed)
 [facility]    CSN: [id number] Arrival date & time: 06/10/23  1348      History   Chief Complaint Chief Complaint  Patient presents with   Cough    HPI Sheryl Landry is a 63 y.o. female.   Sheryl Landry is a 63 y.o. female presenting for chief complaint of Cough and congestion that started approximately 3 weeks ago. Cough is dry and persistently worse at nighttime. No recent fevers, chills, nausea, vomiting, diarrhea, chest pain, rash, leg swelling, or orthopnea. Former smoker, quit a few years ago. History of alcohol  abuse. Denies history of COPD, although there were some hyperinflation changes on her most recent chest x-ray from 7 days ago at urgent care visit on chart review.  She was treated for sinus infection and bronchitis at [facility] visit 7 days ago with albuterol  inhaler and Augmentin . Denies recent steroid use. States cough has not improved and she is not able to sleep at night due to coughing. Using promethazine  DM without much relief. She requests "the cough syrup with the codeine in it".    Cough   Past Medical History:  Diagnosis Date   Alcohol  abuse    Anxiety    Carpal tunnel syndrome, right    History of alcohol  use    12-15-2020  per pt quick alcohol  03/ 2022, however documented in epic 06-29-2020 pt positive elevated  alcohol  level   History of breast cancer    History of eye disorder 05/2019   per pt had episode double vision, evaluation by neurologist , Dr Gracie Lav note in epic strabismus vs 6th nerve palsy;  per pt symptoms resolved   History of left breast cancer 1999   s/p left mastectomy and completed chemo   Hypercholesterolemia    MDD (major depressive disorder)    Sleep disturbance    Type 2 diabetes mellitus (HCC)    followed by pcp  (12-15-2020 pt stated checks blood sugar every other day fasting, average sugar 77-100)   Wears glasses     Patient Active Problem List   Diagnosis Date Noted   Lesion of ulnar nerve, right upper  limb 10/19/2020   Carpal tunnel syndrome of right wrist 10/02/2020   Laceration of right little finger 10/02/2020   Aggressive behavior 01/11/2020   Diplopia 05/03/2019   Generalized anxiety disorder 09/19/2013   Panic attacks 09/19/2013   Homicidal ideation 09/18/2013   Alcohol  abuse 09/17/2013   S/P alcohol  detoxification 09/17/2013    Past Surgical History:  Procedure Laterality Date   BIOPSY  03/12/2018   Procedure: BIOPSY;  Surgeon: Juanita Norlander, MD;  Location: [facility];  Service: General;;   CARPAL TUNNEL RELEASE Right 12/17/2020   Procedure: Right carpal tunnel release and cubital tunnel release guyons canal release;  Surgeon: Ltanya Rummer, MD;  Location: [facility];  Service: Orthopedics;  Laterality: Right;  with MAC   COLONOSCOPY WITH ESOPHAGOGASTRODUODENOSCOPY (EGD)  08/2020   per pt @ [facility]   ESOPHAGOGASTRODUODENOSCOPY (EGD) WITH PROPOFOL  N/A 03/12/2018   Procedure: ESOPHAGOGASTRODUODENOSCOPY (EGD) WITH PROPOFOL ;  Surgeon: Juanita Norlander, MD;  Location: [facility];  Service: General;  Laterality: N/A;   LAPAROSCOPIC INTERNAL HERNIA REPAIR  08/22/2005   @[facility] ;  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      Home Medications    Prior to Admission medications   Medication Sig Start Date End Date Taking? Authorizing Provider  methylPREDNISolone (MEDROL DOSEPAK) 4 MG TBPK tablet Take as prescribed. 06/10/23  Yes Starlene Eaton, FNP  albuterol  (VENTOLIN  HFA) 108 (90 Base) MCG/ACT inhaler Inhale 1-2 puffs into the lungs every 6 (six) hours as needed for wheezing or shortness of breath. 06/10/23   Starlene Eaton, FNP  amoxicillin -clavulanate (AUGMENTIN ) 875-125 MG  tablet Take 1 tablet by mouth every 12 (twelve) hours. 06/03/23   Mecum, Erin E, PA-C  aspirin  81 MG EC tablet Take 81 mg by mouth daily.    [provider]  metoprolol  tartrate (LOPRESSOR ) 50 MG tablet Take 1 tablet by mouth two hours prior to scan 04/04/23   Nahser, Lela Purple, MD  Multiple Vitamin (MULTIVITAMIN WITH MINERALS) TABS tablet Take 1 tablet by mouth every evening.     [provider]  pantoprazole  (PROTONIX ) 20 MG tablet Take 1 tablet (20 mg total) by mouth daily. 08/26/22   Wynetta Heckle, MD  promethazine -dextromethorphan (PROMETHAZINE -DM) 6.25-15 MG/5ML syrup Take 5 mLs by mouth 4 (four) times daily as needed for cough. 06/03/23   Mecum, Erin E, PA-C  QUEtiapine  (SEROQUEL ) 400 MG tablet Take 400 mg by mouth at bedtime.    [provider]  sucralfate  (CARAFATE ) 1 GM/10ML suspension Take 10 mLs (1 g total) by mouth 4 (four) times daily -  with meals and at bedtime. 08/26/22   Wynetta Heckle, MD  venlafaxine  XR (EFFEXOR -XR) 150 MG 24 hr capsule Take 300 mg by mouth at bedtime. 01/04/20   [provider]    Family History Family History  Problem Relation Age of Onset   Diabetes Father    Heart disease Father    Dementia Father    Diabetes type II Mother    Hypertension Mother    Stroke Mother     Social History Social History   Tobacco Use   Smoking status: Former    Current packs/day: 0.00    Types: Cigarettes    Start date: 06/04/2018    Quit date: 12/03/2018    Years since quitting: 4.5   Smokeless tobacco: Never   Tobacco comments:    2-3 cigarettes per day.  Vaping Use   Vaping status: Never Used  Substance Use Topics   Alcohol  use: Not Currently    Comment: hx alcohol  use  (12-15-2020  per pt stopped alcohol  03/ 2022)   Drug use: No     Allergies   Nsaids and Cortisone   Review of Systems Review of Systems  Respiratory:  Positive for cough.   Per HPI   Physical Exam Triage Vital Signs ED Triage Vitals  Encounter  Vitals Group     BP 06/10/23 1355 (!) 163/84     Systolic BP Percentile --      Diastolic BP Percentile --      Pulse Rate 06/10/23 1355 66     Resp 06/10/23 1355 17     Temp 06/10/23 1355 98.5 F (36.9 C)     Temp Source 06/10/23 1355 Oral     SpO2 06/10/23 1355 96 %     Weight --      Height --      Head Circumference --      Peak Flow --      Pain Score 06/10/23 1359 10     Pain  Loc --      Pain Education --      Exclude from Growth Chart --    No data found.  Updated Vital Signs BP (!) 163/84 (BP Location: Right Arm)   Pulse 66   Temp 98.5 F (36.9 C) (Oral)   Resp 17   SpO2 96%   Visual Acuity Right Eye Distance:   Left Eye Distance:   Bilateral Distance:    Right Eye Near:   Left Eye Near:    Bilateral Near:     Physical Exam Vitals and nursing note reviewed.  Constitutional:      Appearance: She is not ill-appearing or toxic-appearing.  HENT:     Head: Normocephalic and atraumatic.     Right Ear: Hearing, tympanic membrane, ear canal and external ear normal.     Left Ear: Hearing, tympanic membrane, ear canal and external ear normal.     Nose: Nose normal.     Mouth/Throat:     Lips: Pink.     Mouth: Mucous membranes are moist. No injury or oral lesions.     Dentition: Normal dentition.     Tongue: No lesions.     Pharynx: Oropharynx is clear. Uvula midline. No pharyngeal swelling, oropharyngeal exudate, posterior oropharyngeal erythema, uvula swelling or postnasal drip.     Tonsils: No tonsillar exudate.  Eyes:     General: Lids are normal. Vision grossly intact. Gaze aligned appropriately.     Extraocular Movements: Extraocular movements intact.     Conjunctiva/sclera: Conjunctivae normal.  Neck:     Trachea: Trachea and phonation normal.  Cardiovascular:     Rate and Rhythm: Normal rate and regular rhythm.     Heart sounds: Normal heart sounds, S1 normal and S2 normal.  Pulmonary:     Effort: Pulmonary effort is normal. No respiratory  distress.     Breath sounds: Normal breath sounds and air entry. No wheezing, rhonchi or rales.     Comments: Lungs clear to auscultation bilaterally, speaking in full sentences without difficulty or increased respiratory effort.  Chest:     Chest wall: No tenderness.  Musculoskeletal:     Cervical back: Neck supple.     Right lower leg: No edema.     Left lower leg: No edema.  Lymphadenopathy:     Cervical: No cervical adenopathy.  Skin:    General: Skin is warm and dry.     Capillary Refill: Capillary refill takes less than 2 seconds.     Findings: No rash.  Neurological:     General: No focal deficit present.     Mental Status: She is alert and oriented to person, place, and time. Mental status is at baseline.     Cranial Nerves: No dysarthria or facial asymmetry.  Psychiatric:        Mood and Affect: Mood normal.        Speech: Speech normal.        Behavior: Behavior normal.        Thought Content: Thought content normal.        Judgment: Judgment normal.      UC Treatments / Results  Labs (all labs ordered are listed, but only abnormal results are displayed) Labs Reviewed - No data to display  EKG   Radiology No results found.  Procedures Procedures (including critical care time)  Medications Ordered in UC Medications - No data to display  Initial Impression / Assessment and Plan / UC Course  I have  reviewed the triage vital signs and the nursing notes.  Pertinent labs & imaging results that were available during my care of the patient were reviewed by me and considered in my medical decision making (see chart for details).   1. Acute bronchitis, acute cough Presentation consistent with acute viral bronchitis. Will treat with steroid, bronchodilator, cough suppressants for symptomatic relief, and expectorants (mucinex) as needed- see AVS.  She has had an allergic reaction to cortisone intraarticular injection in the past (localized rash over the skin) many  years ago. She is open to trying methylprednisolone dose pak to reduce inflammation to the lungs.  History of Type 2 diabetes, advised to watch diet while taking steroid.   Advised to watch for signs of allergic reaction while taking steorid dosepak and stop taking steroid immediately if she develops signs/symptoms of allergic process.  Reviewed chest x-ray from previous urgent care visit showing hyperinflation of the lungs. She is requesting a further workup of her lungs due to persistent coughing, pulmonology referral placed for pulmonary function testing to workup for COPD.   Patient non-toxic in appearance, vital signs hemodynamically stable, no new oxygen requirement, therefore deferred repeat imaging of the chest. Low suspicion for acute cardiopulmonary abnormality based on history and PE.  Counseled patient on potential for adverse effects with medications prescribed/recommended today, strict ER and return-to-clinic precautions discussed, patient verbalized understanding.    Final Clinical Impressions(s) / UC Diagnoses   Final diagnoses:  Acute cough  Acute bronchitis, unspecified organism     Discharge Instructions      You have bronchitis which is inflammation of the upper airways in your lungs due to a virus.   We will treat this with steroids. Do not take any NSAIDs with steroid pills (no ibuprofen , naproxen while taking steroid, this could cause stomach upset).   Use albuterol  every 4-6 hours as needed for cough, shortness of breath, and wheezing.   If you develop any new or worsening symptoms or if your symptoms do not start to improve, please return here or follow-up with your primary care provider. If your symptoms are severe, please go to the emergency room.  ED Prescriptions     Medication Sig Dispense Auth. Provider   predniSONE (DELTASONE) 20 MG tablet  (Status: Discontinued) Take 2 tablets (40 mg total) by mouth daily for 5 days. 10 tablet Starlene Eaton,  FNP   albuterol  (VENTOLIN  HFA) 108 (90 Base) MCG/ACT inhaler Inhale 1-2 puffs into the lungs every 6 (six) hours as needed for wheezing or shortness of breath. 8 g Shella Devoid M, FNP   methylPREDNISolone (MEDROL DOSEPAK) 4 MG TBPK tablet Take as prescribed. 1 each Starlene Eaton, FNP      PDMP not reviewed this encounter.   Starlene Eaton, [place] 06/10/23 1438

## 2023-06-11 ENCOUNTER — Telehealth: Payer: Self-pay | Admitting: Emergency Medicine

## 2023-06-24 ENCOUNTER — Encounter: Payer: Self-pay | Admitting: Emergency Medicine

## 2023-06-24 ENCOUNTER — Ambulatory Visit
Admission: EM | Admit: 2023-06-24 | Discharge: 2023-06-24 | Disposition: A | Discharge status: Home / Self Care | Attending: Internal Medicine | Admitting: Internal Medicine

## 2023-06-24 DIAGNOSIS — N898 Other specified noninflammatory disorders of vagina: Secondary | ICD-10-CM

## 2023-06-24 DIAGNOSIS — G8929 Other chronic pain: Secondary | ICD-10-CM

## 2023-06-24 DIAGNOSIS — M545 Low back pain, unspecified: Secondary | ICD-10-CM | POA: Diagnosis present

## 2023-06-24 DIAGNOSIS — B370 Candidal stomatitis: Secondary | ICD-10-CM | POA: Diagnosis present

## 2023-06-24 MED ORDER — BACLOFEN 5 MG PO TABS: 5.0000 mg | ORAL_TABLET | Freq: Three times a day (TID) | ORAL | 0 refills | Status: AC | PRN

## 2023-06-24 MED ORDER — NYSTATIN 100000 UNIT/ML MT SUSP: 500000.0000 [IU] | Freq: Four times a day (QID) | OROMUCOSAL | 0 refills | Status: AC

## 2023-06-24 NOTE — Discharge Instructions (Signed)
 Your pain is likely due to a muscle strain which will improve on its own with time.   - You may take over the counter medicines to help with pain.  - You may also take the prescribed muscle relaxer as directed as needed for muscle aches/spasm.  Do not take this medication and drive or drink alcohol  as it can make you sleepy.  Mainly use this medicine at nighttime as needed. - Apply heat 20 minutes on then 20 minutes off and perform gentle range of motion exercises to the area of greatest pain to prevent muscle stiffness and provide further pain relief.   Use nystatin mouth rinse to the mouth every 6 hours for the next 7 days.   Clean dentures well to avoid thrush and other mouth infections.  The vaginal swab has been sent for testing. Staff will call if there are any infections on the vaginal swab and treat per protocol.   Red flag symptoms to watch out for are numbness/tingling to the legs, weakness, loss of bowel/bladder control, and/or worsening pain that does not respond well to medicines. Follow-up with your primary care provider or return to urgent care if your symptoms do not improve in the next 3 to 4 days with medications and interventions recommended today. If your symptoms are severe (red flag), please go to the emergency room.

## 2023-06-24 NOTE — ED Triage Notes (Addendum)
 Pt c/o back pain, tingling in vagina for " a long time"  She also noticed sores in her mouth this morning around dentures.

## 2023-06-24 NOTE — ED Provider Notes (Signed)
 [facility]    CSN: [id number] Arrival date & time: 06/24/23  1550      History   Chief Complaint Chief Complaint  Patient presents with   Back Pain    HPI [facility] is a 63 y.o. female.   [facility] is a 63 y.o. female presenting for chief complaint of bilateral upper and lower back pain that has been bothering her for the last several months and worsened over the last several days. Pain is most prominent to the right low back, is triggered by bending and twisting movements, long periods of walking, and improves with heat. Pain is currently mild. No fall, trauma, numbness or tingling, saddle paresthesia, changes to bowel or urinary habits, flank pain, dizziness, fever, chills, extremity weakness, radicular symptoms. Last normal BM was today, no blood/mucous to the stools. No history of spinal surgeries. She is taking tylenol  for low back pain with some relief.   Additionally reports "tingling" and "itching" to the vagina for "years".  Denies vaginal discharge or odor. No concern for STD. States she washes herself multiple times per day to try to help with symptoms without relief. No recent antibiotic/steroid use. Denies vaginal rash and dyspareunia. Status post hysterectomy.   Lastly, she states her gums feel itchy beneath her dentures and she sees white patches on her tongue. She cleans her dentures appropriately and rinses her mouth daily. Denies recent use of inhalers/other oral medications. History of thrush, states this feels similar. Denies open sores or lesions to the mouth.  History of type 2 diabetes, she states this is diet controlled and she does not check her sugars regularly.    Back Pain   Past Medical History:  Diagnosis Date   Alcohol  abuse    Anxiety    Carpal tunnel syndrome, right    History of alcohol  use    12-15-2020  per pt quick alcohol  03/ 2022, however documented in epic 06-29-2020 pt positive elevated  alcohol  level    History of breast cancer    History of eye disorder 05/2019   per pt had episode double vision, evaluation by neurologist , [facility] note in epic strabismus vs 6th nerve palsy;  per pt symptoms resolved   History of left breast cancer 1999   s/p left mastectomy and completed chemo   Hypercholesterolemia    MDD (major depressive disorder)    Sleep disturbance    Type 2 diabetes mellitus (HCC)    followed by pcp  (12-15-2020 pt stated checks blood sugar every other day fasting, average sugar 77-100)   Wears glasses     Patient Active Problem List   Diagnosis Date Noted   Lesion of ulnar nerve, right upper limb 10/19/2020   Carpal tunnel syndrome of right wrist 10/02/2020   Laceration of right little finger 10/02/2020   Aggressive behavior 01/11/2020   Diplopia 05/03/2019   Generalized anxiety disorder 09/19/2013   Panic attacks 09/19/2013   Homicidal ideation 09/18/2013   Alcohol  abuse 09/17/2013   S/P alcohol  detoxification 09/17/2013    Past Surgical History:  Procedure Laterality Date   BIOPSY  03/12/2018   Procedure: BIOPSY;  Surgeon: [facility] Norlander, [facility];  [facility]: [facility];  Service: General;;   CARPAL TUNNEL RELEASE Right 12/17/2020   Procedure: Right carpal tunnel release and cubital tunnel release guyons canal release;  Surgeon: [facility]yl Rummer, [facility];  Location[facility];  Service: Orthopedics;  Laterality: Right;  with MAC   COLONOSCOPY WITH ESOPHAGOGASTRODUODENOSCOPY (EGD)  08/2020   per pt @ [facility]   ESOPHAGOGASTRODUODENOSCOPY (EGD) WITH PROPOFOL  N/A 03/12/2018   Procedure: ESOPHAGOGASTRODUODENOSCOPY (EGD) WITH PROPOFOL ;  Surgeon: [facility]yl Norlander, [facility];  [facility];  Service: General;  Laterality: N/A;   LAPAROSCOPIC INTERNAL HERNIA REPAIR  08/22/2005   @[facility] ;  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   MASTECTOMY Left 1999   REDUCTION MAMMAPLASTY     ROUX-EN-Y GASTRIC BYPASS  10/07/2003   @[facility] ;   laparoscopic approach w/  cholecystectomy   TOTAL ABDOMINAL HYSTERECTOMY W/ BILATERAL SALPINGOOPHORECTOMY  07/26/1999   @[facility]     OB History   No obstetric history on file.      Home Medications    Prior to Admission medications   Medication Sig Start Date End Date Taking? Authorizing Provider  Baclofen 5 MG TABS Take 1 tablet (5 mg total) by mouth every 8 (eight) hours as needed. 06/24/23  Yes [facility]  nystatin (MYCOSTATIN) 100000 UNIT/ML suspension Take 5 mLs (500,000 Units total) by mouth 4 (four) times daily for 7 days. 06/24/23 07/01/23 Yes [facility]  albuterol  (VENTOLIN  HFA) 108 (90 Base) MCG/ACT inhaler Inhale 1-2 puffs into the lungs every 6 (six) hours as needed for wheezing or shortness of breath. 06/10/23   [facility]  amoxicillin -clavulanate (AUGMENTIN ) 875-125 MG tablet Take 1 tablet by mouth every 12 (twelve) hours. 06/03/23   [facility]  aspirin  81 MG EC tablet Take 81 mg by mouth daily.    [facility]  methylPREDNISolone  (MEDROL  DOSEPAK) 4 MG TBPK tablet Take as prescribed. 06/10/23   [facility]  metoprolol  tartrate (LOPRESSOR ) 50 MG tablet Take 1 tablet by mouth two hours prior to scan 04/04/23   [facility]  Multiple Vitamin (MULTIVITAMIN WITH MINERALS) TABS tablet Take 1 tablet by mouth every evening.     [facility]  pantoprazole  (PROTONIX ) 20 MG tablet Take 1 tablet (20 mg total) by mouth daily. 08/26/22   [facility]  promethazine -dextromethorphan (PROMETHAZINE -DM) 6.25-15 MG/5ML syrup Take 5 mLs by mouth 4 (four) times daily as needed for cough. 06/03/23   [facility]  QUEtiapine  (SEROQUEL ) 400 MG tablet Take 400 mg by mouth at bedtime.    [facility]  sucralfate  (CARAFATE ) 1 GM/10ML suspension Take 10 mLs (1 g total) by mouth 4 (four) times daily -  with meals and at bedtime. 08/26/22   [facility]  venlafaxine  XR (EFFEXOR -XR) 150 MG 24 hr capsule Take 300 mg  by mouth at bedtime. 01/04/20   [facility]    Family History Family History  Problem Relation Age of Onset   Diabetes Father    Heart disease Father    Dementia Father    Diabetes type II Mother    Hypertension Mother    Stroke Mother     Social History Social History   Tobacco Use   Smoking status: Former    Current packs/day: 0.00    Types: Cigarettes    Start date: 06/04/2018    Quit date: 12/03/2018    Years since quitting: 4.5   Smokeless tobacco: Never   Tobacco comments:    2-3 cigarettes per day.  Vaping Use   Vaping status: Never Used  Substance Use Topics   Alcohol  use: Not Currently    Comment: hx alcohol  use  (12-15-2020  per pt stopped alcohol  03/ 2022)   Drug use: No     Allergies  Nsaids and Cortisone   Review of Systems Review of Systems  Musculoskeletal:  Positive for back pain.  Per HPI   Physical Exam Triage Vital Signs ED Triage Vitals [06/24/23 1553]  Encounter Vitals Group     BP (!) 145/94     Systolic BP Percentile      Diastolic BP Percentile      Pulse Rate (!) 105     Resp 17     Temp      Temp Source Oral     SpO2 100 %     Weight      Height      Head Circumference      Peak Flow      Pain Score      Pain Loc      Pain Education      Exclude from Growth Chart    No data found.  Updated Vital Signs BP (!) 145/94 (BP Location: Right Arm)   Pulse (!) 105   Resp 17   SpO2 100%   Visual Acuity Right Eye Distance:   Left Eye Distance:   Bilateral Distance:    Right Eye Near:   Left Eye Near:    Bilateral Near:     Physical Exam Vitals and nursing note reviewed.  Constitutional:      Appearance: She is not ill-appearing or toxic-appearing.  HENT:     Head: Normocephalic and atraumatic.     Jaw: There is normal jaw occlusion.     Right Ear: Hearing, tympanic membrane, ear canal and external ear normal.     Left Ear: Hearing, tympanic membrane, ear canal and external ear normal.     Nose: Nose  normal.     Mouth/Throat:     Lips: Pink.     Mouth: Mucous membranes are moist. No injury or oral lesions.     Dentition: Abnormal dentition. Has dentures. No dental tenderness, gingival swelling or dental abscesses.     Tongue: Lesions (white patchy film to the tongue) present. Tongue does not deviate from midline.     Palate: No mass and lesions.     Pharynx: Oropharynx is clear. Uvula midline. No pharyngeal swelling, oropharyngeal exudate, posterior oropharyngeal erythema, uvula swelling or postnasal drip.     Tonsils: No tonsillar exudate.     Comments: Dentures removed for exam. No gingival erythema, swelling, or changes suspicious for bacterial infection. No open sore or lesions to the oral mucosa.  Eyes:     General: Lids are normal. Vision grossly intact. Gaze aligned appropriately.     Extraocular Movements: Extraocular movements intact.     Conjunctiva/sclera: Conjunctivae normal.  Neck:     Trachea: Trachea and phonation normal.  Cardiovascular:     Rate and Rhythm: Normal rate and regular rhythm.     Heart sounds: Normal heart sounds, S1 normal and S2 normal.  Pulmonary:     Effort: Pulmonary effort is normal. No respiratory distress.     Breath sounds: Normal breath sounds and air entry. No wheezing, rhonchi or rales.  Chest:     Chest wall: No tenderness.  Genitourinary:    Comments: Offered exam to ensure absence of vaginal rash, patient declined. Musculoskeletal:     Cervical back: Normal and neck supple.     Thoracic back: Normal.     Lumbar back: Tenderness present. No swelling, edema, deformity, signs of trauma, lacerations, spasms or bony tenderness. Normal range of motion. Negative right straight leg raise test and  negative left straight leg raise test. No scoliosis.       Back:  Lymphadenopathy:     Cervical: No cervical adenopathy.  Skin:    General: Skin is warm and dry.     Capillary Refill: Capillary refill takes less than 2 seconds.     Findings: No  rash.  Neurological:     General: No focal deficit present.     Mental Status: She is alert and oriented to person, place, and time. Mental status is at baseline.     GCS: GCS eye subscore is 4. GCS verbal subscore is 5. GCS motor subscore is 6.     Cranial Nerves: Cranial nerves 2-12 are intact. No dysarthria or facial asymmetry.     Sensory: Sensation is intact.     Motor: Motor function is intact. No weakness, tremor, abnormal muscle tone or pronator drift.     Coordination: Coordination is intact. Romberg sign negative. Coordination normal. Finger-Nose-Finger Test normal.     Gait: Gait is intact.     Comments: Strength and sensation intact to bilateral upper and lower extremities (5/5). Moves all 4 extremities with normal coordination voluntarily. Non-focal neuro exam.   Psychiatric:        Mood and Affect: Mood normal.        Speech: Speech normal.        Behavior: Behavior normal.        Thought Content: Thought content normal.        Judgment: Judgment normal.      UC Treatments / Results  Labs (all labs ordered are listed, but only abnormal results are displayed)   EKG   Radiology No results found.  Procedures Procedures (including critical care time)  Medications Ordered in UC Medications - No data to display  Initial Impression / Assessment and Plan / UC Course  I have reviewed the triage vital signs and the nursing notes.  Pertinent labs & imaging results that were available during my care of the patient were reviewed by me and considered in my medical decision making (see chart for details).   1. Oral thrush Nystatin ordered. No signs of bacterial infection on exam.  2. Chronic right sided low back pain without sciatica Evaluation suggests pain is musculoskeletal in nature. Will manage this with rest, gentle ROM exercises, heat therapy, tylenol  as needed for pain, and as needed use of muscle relaxer. Drowsiness precautions discussed regarding muscle  relaxer use. Imaging: no indication for imaging based on stable musculoskeletal exam findings May follow-up with orthopedics as needed.  3. Vaginal itching STI labs pending, will notify patient of positive results and treat accordingly per protocol when labs result.  History of alcoholism, treat with intravaginal flagyl for BV if she is positive due to risks for disulfiram reaction with oral flagyl.  Patient declines HIV and syphilis testing today.   Patient to avoid sexual intercourse until screening testing comes back.   Education provided regarding safe sexual practices and patient encouraged to use protection to prevent spread of STIs.    Counseled patient on potential for adverse effects with medications prescribed/recommended today, strict ER and return-to-clinic precautions discussed, patient verbalized understanding.    Final Clinical Impressions(s) / UC Diagnoses   Final diagnoses:  Oral thrush  Chronic right-sided low back pain without sciatica  Vaginal itching     Discharge Instructions      Your pain is likely due to a muscle strain which will improve on its own with time.   -  You may take over the counter medicines to help with pain.  - You may also take the prescribed muscle relaxer as directed as needed for muscle aches/spasm.  Do not take this medication and drive or drink alcohol  as it can make you sleepy.  Mainly use this medicine at nighttime as needed. - Apply heat 20 minutes on then 20 minutes off and perform gentle range of motion exercises to the area of greatest pain to prevent muscle stiffness and provide further pain relief.   Use nystatin mouth rinse to the mouth every 6 hours for the next 7 days.   Clean dentures well to avoid thrush and other mouth infections.  The vaginal swab has been sent for testing. Staff will call if there are any infections on the vaginal swab and treat per protocol.   Red flag symptoms to watch out for are numbness/tingling  to the legs, weakness, loss of bowel/bladder control, and/or worsening pain that does not respond well to medicines. Follow-up with your primary care provider or return to urgent care if your symptoms do not improve in the next 3 to 4 days with medications and interventions recommended today. If your symptoms are severe (red flag), please go to the emergency room.      ED Prescriptions     Medication Sig Dispense Auth. Provider   nystatin (MYCOSTATIN) 100000 UNIT/ML suspension Take 5 mLs (500,000 Units total) by mouth 4 (four) times daily for 7 days. 120 mL Shella Devoid M, [facility]   Baclofen 5 MG TABS Take 1 tablet (5 mg total) by mouth every 8 (eight) hours as needed. 15 tablet [facility]      PDMP not reviewed this encounter.   [facility]yl [facility]ry, [place] 06/27/23 2132

## 2023-06-27 LAB — CERVICOVAGINAL ANCILLARY ONLY
Bacterial Vaginitis (gardnerella): POSITIVE — AB
Candida Glabrata: POSITIVE — AB
Candida Vaginitis: NEGATIVE
Chlamydia: NEGATIVE
Comment: NEGATIVE
Comment: NEGATIVE
Comment: NEGATIVE
Comment: NEGATIVE
Comment: NEGATIVE
Comment: NORMAL
Neisseria Gonorrhea: NEGATIVE
Trichomonas: NEGATIVE

## 2023-06-28 ENCOUNTER — Ambulatory Visit (HOSPITAL_COMMUNITY): Payer: Self-pay

## 2023-06-28 MED ORDER — METRONIDAZOLE 500 MG PO TABS: 500.0000 mg | ORAL_TABLET | Freq: Two times a day (BID) | ORAL | 0 refills | Status: AC

## 2023-07-14 ENCOUNTER — Ambulatory Visit: Admitting: Pulmonary Disease

## 2023-07-14 ENCOUNTER — Encounter: Payer: Self-pay | Admitting: Pulmonary Disease

## 2023-07-17 ENCOUNTER — Telehealth (HOSPITAL_COMMUNITY): Payer: Self-pay | Admitting: Emergency Medicine

## 2023-07-17 NOTE — Telephone Encounter (Signed)
 Reaching out to patient to offer assistance regarding upcoming cardiac imaging study; pt verbalizes understanding of appt date/time, parking situation and where to check in, pre-test NPO status and medications ordered, and verified current allergies; name and call back number provided for further questions should they arise Rockwell Alexandria RN Navigator Cardiac Imaging [facility] [phone] office [phone] cell

## 2023-07-18 ENCOUNTER — Ambulatory Visit (HOSPITAL_COMMUNITY)
Admission: RE | Admit: 2023-07-18 | Discharge: 2023-07-18 | Disposition: A | Discharge status: Home / Self Care | Source: Ambulatory Visit | Attending: Cardiovascular Disease | Admitting: Cardiovascular Disease

## 2023-07-18 DIAGNOSIS — R079 Chest pain, unspecified: Secondary | ICD-10-CM | POA: Diagnosis not present

## 2023-07-18 DIAGNOSIS — I200 Unstable angina: Secondary | ICD-10-CM

## 2023-07-18 DIAGNOSIS — I2510 Atherosclerotic heart disease of native coronary artery without angina pectoris: Secondary | ICD-10-CM | POA: Diagnosis not present

## 2023-07-18 DIAGNOSIS — Z01810 Encounter for preprocedural cardiovascular examination: Secondary | ICD-10-CM | POA: Diagnosis present

## 2023-07-18 MED ORDER — NITROGLYCERIN 0.4 MG SL SUBL
0.8000 mg | SUBLINGUAL_TABLET | Freq: Once | SUBLINGUAL | Status: AC
  Administered 2023-07-18: 0.8 mg via SUBLINGUAL

## 2023-07-18 MED ORDER — IOHEXOL 350 MG/ML SOLN
100.0000 mL | Freq: Once | INTRAVENOUS | Status: AC | PRN
Start: 2023-07-18 — End: 2023-07-18
  Administered 2023-07-18: 100 mL via INTRAVENOUS

## 2023-07-19 ENCOUNTER — Ambulatory Visit: Payer: Self-pay | Admitting: Cardiovascular Disease

## 2023-09-07 DIAGNOSIS — H2013 Chronic iridocyclitis, bilateral: Secondary | ICD-10-CM | POA: Insufficient documentation

## 2023-09-07 DIAGNOSIS — E7800 Pure hypercholesterolemia, unspecified: Secondary | ICD-10-CM | POA: Insufficient documentation

## 2023-09-07 DIAGNOSIS — R945 Abnormal results of liver function studies: Secondary | ICD-10-CM | POA: Insufficient documentation

## 2023-09-07 DIAGNOSIS — L308 Other specified dermatitis: Secondary | ICD-10-CM | POA: Insufficient documentation

## 2023-09-07 DIAGNOSIS — F324 Major depressive disorder, single episode, in partial remission: Secondary | ICD-10-CM | POA: Insufficient documentation

## 2023-09-07 DIAGNOSIS — D649 Anemia, unspecified: Secondary | ICD-10-CM | POA: Insufficient documentation

## 2023-09-07 DIAGNOSIS — M2550 Pain in unspecified joint: Secondary | ICD-10-CM | POA: Insufficient documentation

## 2023-09-07 DIAGNOSIS — Z860100 Personal history of colon polyps, unspecified: Secondary | ICD-10-CM | POA: Insufficient documentation

## 2023-09-07 DIAGNOSIS — Z9884 Bariatric surgery status: Secondary | ICD-10-CM | POA: Insufficient documentation

## 2023-09-07 DIAGNOSIS — I679 Cerebrovascular disease, unspecified: Secondary | ICD-10-CM | POA: Insufficient documentation

## 2023-09-07 DIAGNOSIS — R400 Somnolence: Secondary | ICD-10-CM | POA: Insufficient documentation

## 2023-09-07 DIAGNOSIS — N952 Postmenopausal atrophic vaginitis: Secondary | ICD-10-CM | POA: Insufficient documentation

## 2023-09-07 DIAGNOSIS — R451 Restlessness and agitation: Secondary | ICD-10-CM | POA: Insufficient documentation

## 2023-09-07 DIAGNOSIS — M179 Osteoarthritis of knee, unspecified: Secondary | ICD-10-CM | POA: Insufficient documentation

## 2023-09-07 DIAGNOSIS — G4700 Insomnia, unspecified: Secondary | ICD-10-CM | POA: Insufficient documentation

## 2023-09-07 DIAGNOSIS — H2010 Chronic iridocyclitis, unspecified eye: Secondary | ICD-10-CM | POA: Insufficient documentation

## 2023-09-07 DIAGNOSIS — E049 Nontoxic goiter, unspecified: Secondary | ICD-10-CM | POA: Insufficient documentation

## 2023-09-07 DIAGNOSIS — Z8639 Personal history of other endocrine, nutritional and metabolic disease: Secondary | ICD-10-CM | POA: Insufficient documentation

## 2023-09-07 DIAGNOSIS — K2970 Gastritis, unspecified, without bleeding: Secondary | ICD-10-CM | POA: Insufficient documentation

## 2023-09-07 DIAGNOSIS — E663 Overweight: Secondary | ICD-10-CM | POA: Insufficient documentation

## 2023-09-07 DIAGNOSIS — Z853 Personal history of malignant neoplasm of breast: Secondary | ICD-10-CM | POA: Insufficient documentation

## 2023-09-07 DIAGNOSIS — D126 Benign neoplasm of colon, unspecified: Secondary | ICD-10-CM | POA: Insufficient documentation

## 2023-09-12 ENCOUNTER — Ambulatory Visit: Admitting: Neurology

## 2023-09-12 ENCOUNTER — Encounter: Payer: Self-pay | Admitting: Neurology

## 2023-09-12 ENCOUNTER — Other Ambulatory Visit: Payer: Self-pay | Admitting: Neurology

## 2023-09-12 VITALS — BP 138/84 | HR 69 | Resp 17 | Ht 62.0 in | Wt 148.0 lb

## 2023-09-12 DIAGNOSIS — F32A Depression, unspecified: Secondary | ICD-10-CM | POA: Diagnosis not present

## 2023-09-12 DIAGNOSIS — R4189 Other symptoms and signs involving cognitive functions and awareness: Secondary | ICD-10-CM | POA: Diagnosis not present

## 2023-09-12 MED ORDER — ALPRAZOLAM 1 MG PO TABS
ORAL_TABLET | ORAL | 0 refills | Status: DC
Start: 2023-09-12 — End: 2023-09-12

## 2023-09-12 MED ORDER — ALPRAZOLAM 1 MG PO TABS: ORAL_TABLET | ORAL | 0 refills | Status: AC

## 2023-09-12 NOTE — Progress Notes (Signed)
 Chief Complaint  Patient presents with   New Patient (Initial Visit)    Rm14, alone, NX [facility] 05/2019/[facility] Sheryl Gull MD [id number]/memory loss: patient scored 24 on moca    ASSESSMENT AND PLAN  [facility] is a 63 y.o. female   Mild cognitive impairment In the setting of depression anxiety, family stress,  MoCA examination 24/30  MRI of the brain to rule out structural abnormality  Laboratory to rule out treatable etiology, patient do not want Alzheimer specific profile  Emphasized the importance of better control of her mood disorder  Follow-up dependent on above workup result  DIAGNOSTIC DATA (LABS, IMAGING, TESTING) - I reviewed patient records, labs, notes, testing and imaging myself where available.   MEDICAL HISTORY:  [facility] is a 63 year old female, seen in request by her primary care from [facility] Dr. Gull, Carlin Sheryl for evaluation of cognitive impairment, initially evaluation was on September 12, 2023  History is obtained from the patient and review of electronic medical records. I personally reviewed pertinent available imaging films in PACS.   PMHx of  HTN Depression, anxiety Left breast cancer, s/p lobectomy in 1999, chemotherapy. Gastric Bypass in 2009, with 175 weight loss. 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, worked in early childhood education, history of gastric bypass surgery was significant weight loss, also had a history of alcohol  abuse, suffered depression anxiety, reported significant stress at home, MoCA examination 24/30  There was no family history of dementia, mother died from stroke heart attack at age 80  She noticed gradual onset of memory loss since 2023, hard for her to follow all recipe, when she goes upstairs, she forgot why she is there,  She take care of her 52-year-old grandchildren, limited activity due to joints pain,   PHYSICAL EXAM:   Vitals:   09/12/23 1059 09/12/23 1114  BP: (!)  166/80 138/84  Pulse: 69   Resp: 17   SpO2: 99%   Weight: 148 lb (67.1 kg)   Height: 5' 2 (1.575 m)      Body mass index is 27.07 kg/m.  PHYSICAL EXAMNIATION:  Gen: NAD, conversant, well nourised, well groomed                     Cardiovascular: Regular rate rhythm, no peripheral edema, warm, nontender. Eyes: Conjunctivae clear without exudates or hemorrhage Neck: Supple, no carotid bruits. Pulmonary: Clear to auscultation bilaterally   NEUROLOGICAL EXAM:  MENTAL STATUS: Speech/cognition: Awake, alert, oriented to history taking and casual conversation    09/12/2023   11:11 AM  Montreal Cognitive Assessment   Visuospatial/ Executive (0/5) 4  Naming (0/3) 3  Attention: Read list of digits (0/2) 2  Attention: Read list of letters (0/1) 1  Attention: Serial 7 subtraction starting at 100 (0/3) 1  Language: Repeat phrase (0/2) 0  Language : Fluency (0/1) 1  Abstraction (0/2) 2  Delayed Recall (0/5) 4  Orientation (0/6) 6  Total 24    CRANIAL NERVES: CN II: Visual fields are full to confrontation. Pupils are round equal and briskly reactive to light. CN III, IV, VI: extraocular movement are normal. No ptosis. CN V: Facial sensation is intact to light touch CN VII: Face is symmetric with normal eye closure  CN VIII: Hearing is normal to causal conversation. CN IX, X: Phonation is normal. CN XI: Head turning and shoulder shrug are intact  MOTOR: There is no pronator drift of out-stretched arms.  Muscle bulk and tone are normal. Muscle strength is normal.  REFLEXES: Reflexes are 2+ and symmetric at the biceps, triceps, knees, and ankles. Plantar responses are flexor.  SENSORY: Intact to light touch, pinprick and vibratory sensation are intact in fingers and toes.  COORDINATION: There is no trunk or limb dysmetria noted.  GAIT/STANCE: Posture is normal. Gait is steady   REVIEW OF SYSTEMS:  Full 14 system review of systems performed and notable only for as  above All other review of systems were negative.   ALLERGIES: Allergies  Allergen Reactions   Atorvastatin     Other Reaction(s): abnormal liver test   Nsaids Other (See Comments)    because of gastric bypass surgery  Other Reaction(s): because of gastric bypass surgery   Cortisone Other (See Comments) and Rash    rash after shoulder injection  Other Reaction(s): rash after shoulder injection    HOME MEDICATIONS: Current Outpatient Medications  Medication Sig Dispense Refill   aspirin  81 MG EC tablet Take 81 mg by mouth daily.     Baclofen  5 MG TABS Take 1 tablet (5 mg total) by mouth every 8 (eight) hours as needed. 15 tablet 0   methylPREDNISolone  (MEDROL  DOSEPAK) 4 MG TBPK tablet Take as prescribed. 1 each 0   metoprolol  tartrate (LOPRESSOR ) 50 MG tablet Take 1 tablet by mouth two hours prior to scan 1 tablet 0   Multiple Vitamin (MULTIVITAMIN WITH MINERALS) TABS tablet Take 1 tablet by mouth every evening.      pantoprazole  (PROTONIX ) 20 MG tablet Take 1 tablet (20 mg total) by mouth daily. 30 tablet 0   QUEtiapine  (SEROQUEL ) 400 MG tablet Take 400 mg by mouth at bedtime.     sucralfate  (CARAFATE ) 1 GM/10ML suspension Take 10 mLs (1 g total) by mouth 4 (four) times daily -  with meals and at bedtime. 420 mL 1   venlafaxine  XR (EFFEXOR -XR) 150 MG 24 hr capsule Take 300 mg by mouth at bedtime.     No current facility-administered medications for this visit.    PAST MEDICAL HISTORY: Past Medical History:  Diagnosis Date   Alcohol  abuse    Anxiety    Carpal tunnel syndrome, right    History of alcohol  use    12-15-2020  per pt quick alcohol  03/ 2022, however documented in epic 06-29-2020 pt positive elevated  alcohol  level   History of breast cancer    History of eye disorder 05/2019   per pt had episode double vision, evaluation by neurologist , Dr Onita note in epic strabismus vs 6th nerve palsy;  per pt symptoms resolved   History of left breast cancer 1999   s/p left  mastectomy and completed chemo   Hypercholesterolemia    MDD (major depressive disorder)    Sleep disturbance    Type 2 diabetes mellitus (HCC)    followed by pcp  (12-15-2020 pt stated checks blood sugar every other day fasting, average sugar 77-100)   Wears glasses     PAST SURGICAL HISTORY: Past Surgical History:  Procedure Laterality Date   BIOPSY  03/12/2018   Procedure: BIOPSY;  Surgeon: Ethyl Lenis, MD;  [facility]: [facility];  Service: General;;   CARPAL TUNNEL RELEASE Right 12/17/2020   Procedure: Right carpal tunnel release and cubital tunnel release guyons canal release;  Surgeon: Alyse Agent, MD;  Location: [facility] ;  Service: Orthopedics;  Laterality: Right;  with MAC   COLONOSCOPY WITH ESOPHAGOGASTRODUODENOSCOPY (EGD)  08/2020   per pt @  [facility]   ESOPHAGOGASTRODUODENOSCOPY (EGD) WITH PROPOFOL  N/A 03/12/2018   Procedure: ESOPHAGOGASTRODUODENOSCOPY (EGD) WITH PROPOFOL ;  Surgeon: Ethyl Lenis, MD;  [facility];  Service: General;  Laterality: N/A;   LAPAROSCOPIC INTERNAL HERNIA REPAIR  08/22/2005   @[facility] ;  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   MASTECTOMY Left 1999   REDUCTION MAMMAPLASTY     ROUX-EN-Y GASTRIC BYPASS  10/07/2003   @[facility] ;   laparoscopic approach w/ cholecystectomy   TOTAL ABDOMINAL HYSTERECTOMY W/ BILATERAL SALPINGOOPHORECTOMY  07/26/1999   @[facility]     FAMILY HISTORY: Family History  Problem Relation Age of Onset   Diabetes Father    Heart disease Father    Dementia Father    Diabetes type II Mother    Hypertension Mother    Stroke Mother     SOCIAL HISTORY: Social History   Socioeconomic History   Marital status: Married    Spouse name: Not on file   Number of children: 3   Years of education: Some coll   Highest education level: Not on file  Occupational History   Occupation: Unemployed  Tobacco Use   Smoking status: Former    Current packs/day: 0.00    Types: Cigarettes    Start date:  06/04/2018    Quit date: 12/03/2018    Years since quitting: 4.7   Smokeless tobacco: Never   Tobacco comments:    2-3 cigarettes per day.  Vaping Use   Vaping status: Never Used  Substance and Sexual Activity   Alcohol  use: Not Currently    Comment: hx alcohol  use  (12-15-2020  per pt stopped alcohol  03/ 2022)   Drug use: No   Sexual activity: Not on file  Other Topics Concern   Not on file  Social History Narrative   Lives at home with her husband.   Right-handed.   4 cups caffeine per day.   Social Drivers of Corporate investment banker Strain: Not on file  Food Insecurity: Not on file  Transportation Needs: Not on file  Physical Activity: Not on file  Stress: Not on file  Social Connections: Not on file  Intimate Partner Violence: Not on file      Modena Callander, M.D. Ph.D.  [facility] [address], [address] [place], [place] [address] Ph: [phone] Fax: [phone]  CC:  Okey Carlin Redbird, MD [address] [place],  [place] [address]  Okey Carlin Redbird, MD

## 2023-09-12 NOTE — Telephone Encounter (Signed)
 [place] from [facility] called stating that Pt order was sent over and instruction was unclear can Md or Nurse cal Pharmacy to give correct information   ALPRAZolam  (XANAX ) 1 MG tablet    [address] [facility] - [place], [place] - [address] (Ph: [phone])

## 2023-09-18 ENCOUNTER — Telehealth: Payer: Self-pay | Admitting: Neurology

## 2023-09-18 NOTE — Telephone Encounter (Signed)
 Spoke with patient and scheduled MR brain wo contrast at [facility] for 09/27/23 at 4pm. Patient would like medication to help with claustrophobia and is aware to have a driver for appointment if medication is taken. Patient is also asking for the results for her blood work.

## 2023-09-19 NOTE — Telephone Encounter (Signed)
 Call to patient, advised medication was sent in  on 8/12 to [facility] on [address]. Also advised not all results were in and would reach out when all results when in. Patient verbalized understanding.

## 2023-09-26 ENCOUNTER — Telehealth: Payer: Self-pay | Admitting: Neurology

## 2023-09-26 LAB — ATN PROFILE
A -- Beta-amyloid 42/40 Ratio: 0.113 (ref >0.102)
Beta-amyloid 40: 215.92 pg/mL
Beta-amyloid 42: 24.5 pg/mL
N -- NfL, Plasma: 1.73 pg/mL (ref 0.00–3.65)
T -- p-tau181: 0.9 pg/mL (ref 0.00–0.97)

## 2023-09-26 LAB — CBC WITH DIFFERENTIAL/PLATELET
Basophils Absolute: 0 x10E3/uL (ref 0.0–0.2)
Basos: 1 %
EOS (ABSOLUTE): 0.1 x10E3/uL (ref 0.0–0.4)
Eos: 2 %
Hematocrit: 39.6 % (ref 34.0–46.6)
Hemoglobin: 12.2 g/dL (ref 11.1–15.9)
Immature Grans (Abs): 0 x10E3/uL (ref 0.0–0.1)
Immature Granulocytes: 0 %
Lymphocytes Absolute: 2.6 x10E3/uL (ref 0.7–3.1)
Lymphs: 40 %
MCH: 26.9 pg (ref 26.6–33.0)
MCHC: 30.8 g/dL — ABNORMAL LOW (ref 31.5–35.7)
MCV: 87 fL (ref 79–97)
Monocytes Absolute: 0.5 x10E3/uL (ref 0.1–0.9)
Monocytes: 8 %
Neutrophils Absolute: 3.1 x10E3/uL (ref 1.4–7.0)
Neutrophils: 49 %
Platelets: 292 x10E3/uL (ref 150–450)
RBC: 4.53 x10E6/uL (ref 3.77–5.28)
RDW: 14.3 % (ref 11.7–15.4)
WBC: 6.4 x10E3/uL (ref 3.4–10.8)

## 2023-09-26 LAB — COMPREHENSIVE METABOLIC PANEL WITH GFR
ALT: 21 IU/L (ref 0–32)
AST: 32 IU/L (ref 0–40)
Albumin: 4.2 g/dL (ref 3.9–4.9)
Alkaline Phosphatase: 127 IU/L — ABNORMAL HIGH (ref 44–121)
BUN/Creatinine Ratio: 12 (ref 12–28)
BUN: 12 mg/dL (ref 8–27)
Bilirubin Total: <0.2 mg/dL (ref 0.0–1.2)
CO2: 25 mmol/L (ref 20–29)
Calcium: 8.8 mg/dL (ref 8.7–10.3)
Chloride: 100 mmol/L (ref 96–106)
Creatinine, Ser: 0.97 mg/dL (ref 0.57–1.00)
Globulin, Total: 2.7 g/dL (ref 1.5–4.5)
Glucose: 69 mg/dL — ABNORMAL LOW (ref 70–99)
Potassium: 3.6 mmol/L (ref 3.5–5.2)
Sodium: 139 mmol/L (ref 134–144)
Total Protein: 6.9 g/dL (ref 6.0–8.5)
eGFR: 66 mL/min/1.73 (ref >59)

## 2023-09-26 LAB — TSH: TSH: 0.606 u[IU]/mL (ref 0.450–4.500)

## 2023-09-26 LAB — APOE ALZHEIMER'S RISK

## 2023-09-26 LAB — VITAMIN B12: Vitamin B-12: >2000 pg/mL — ABNORMAL HIGH (ref 232–1245)

## 2023-09-26 LAB — RPR: RPR Ser Ql: NONREACTIVE

## 2023-09-26 LAB — HIV ANTIBODY (ROUTINE TESTING W REFLEX): HIV Screen 4th Generation wRfx: NONREACTIVE

## 2023-09-26 NOTE — Telephone Encounter (Signed)
 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

## 2023-09-26 NOTE — Telephone Encounter (Signed)
 Called and relayed the results to pt. Pt voiced gratitude and understanding

## 2023-09-27 ENCOUNTER — Ambulatory Visit

## 2023-09-27 DIAGNOSIS — R4189 Other symptoms and signs involving cognitive functions and awareness: Secondary | ICD-10-CM

## 2023-10-03 ENCOUNTER — Ambulatory Visit: Payer: Self-pay | Admitting: Neurology

## 2023-10-04 NOTE — Telephone Encounter (Signed)
 Returned call and clarified results better.   MEDICAL RECORDS REQUEST:  Pt would like the recent labs and mri results faxed to PCP and a MRI as well.   Pt is also frequesting a copy of the mri cd

## 2023-10-04 NOTE — Telephone Encounter (Signed)
-----   Message from Sheryl Landry sent at 10/04/2023  3:29 PM EDT -----

## 2023-10-04 NOTE — Telephone Encounter (Signed)
 Pt is asking for a call to discuss the results to her MRI further, pt also wants to relay what Dr Okey has relayed to her.

## 2024-01-31 ENCOUNTER — Other Ambulatory Visit (HOSPITAL_COMMUNITY): Payer: Self-pay | Admitting: Family Medicine

## 2024-01-31 DIAGNOSIS — M5416 Radiculopathy, lumbar region: Secondary | ICD-10-CM

## 2024-02-02 ENCOUNTER — Ambulatory Visit (HOSPITAL_BASED_OUTPATIENT_CLINIC_OR_DEPARTMENT_OTHER)
Admission: RE | Admit: 2024-02-02 | Discharge: 2024-02-02 | Disposition: A | Discharge status: Home / Self Care | Source: Ambulatory Visit | Attending: Family Medicine | Admitting: Family Medicine

## 2024-02-02 DIAGNOSIS — M5416 Radiculopathy, lumbar region: Secondary | ICD-10-CM | POA: Diagnosis present

## 2024-02-23 ENCOUNTER — Other Ambulatory Visit (HOSPITAL_BASED_OUTPATIENT_CLINIC_OR_DEPARTMENT_OTHER): Payer: Self-pay | Admitting: Student

## 2024-02-23 DIAGNOSIS — M5412 Radiculopathy, cervical region: Secondary | ICD-10-CM

## 2024-03-09 ENCOUNTER — Ambulatory Visit (HOSPITAL_BASED_OUTPATIENT_CLINIC_OR_DEPARTMENT_OTHER)
